# Patient Record
Sex: Male | Born: 1965 | Hispanic: Yes | Smoking: Former smoker
Health system: Southern US, Community
[De-identification: ages and names within clinical notes are randomized; demographics above are authoritative.]

## PROBLEM LIST (undated history)

## (undated) HISTORY — PX: CHOLECYSTECTOMY: SHX55

---

## 2014-10-15 ENCOUNTER — Emergency Department (HOSPITAL_COMMUNITY)
Admission: EM | Admit: 2014-10-15 | Discharge: 2014-10-15 | Disposition: A | Discharge status: Home / Self Care | Payer: 59 | Attending: Emergency Medicine | Admitting: Emergency Medicine

## 2014-10-15 ENCOUNTER — Emergency Department (HOSPITAL_COMMUNITY): Payer: 59

## 2014-10-15 ENCOUNTER — Encounter (HOSPITAL_COMMUNITY): Payer: Self-pay

## 2014-10-15 DIAGNOSIS — R079 Chest pain, unspecified: Secondary | ICD-10-CM | POA: Diagnosis not present

## 2014-10-15 LAB — COMPREHENSIVE METABOLIC PANEL
ALK PHOS: 90 U/L (ref 39–117)
ALT: 59 U/L — AB (ref 0–53)
ANION GAP: 10 (ref 5–15)
AST: 30 U/L (ref 0–37)
Albumin: 4.3 g/dL (ref 3.5–5.2)
BUN: 17 mg/dL (ref 6–23)
CALCIUM: 8.9 mg/dL (ref 8.4–10.5)
CO2: 22 mmol/L (ref 19–32)
Chloride: 104 mmol/L (ref 96–112)
Creatinine, Ser: 0.98 mg/dL (ref 0.50–1.35)
Glucose, Bld: 109 mg/dL — ABNORMAL HIGH (ref 70–99)
POTASSIUM: 3.3 mmol/L — AB (ref 3.5–5.1)
Sodium: 136 mmol/L (ref 135–145)
Total Bilirubin: 0.8 mg/dL (ref 0.3–1.2)
Total Protein: 7.8 g/dL (ref 6.0–8.3)

## 2014-10-15 LAB — CBC WITH DIFFERENTIAL/PLATELET
BASOS PCT: 0 % (ref 0–1)
Basophils Absolute: 0 10*3/uL (ref 0.0–0.1)
Eosinophils Absolute: 0.1 10*3/uL (ref 0.0–0.7)
Eosinophils Relative: 2 % (ref 0–5)
HCT: 41.7 % (ref 39.0–52.0)
HEMOGLOBIN: 15 g/dL (ref 13.0–17.0)
Lymphocytes Relative: 44 % (ref 12–46)
Lymphs Abs: 2.8 10*3/uL (ref 0.7–4.0)
MCH: 33 pg (ref 26.0–34.0)
MCHC: 36 g/dL (ref 30.0–36.0)
MCV: 91.6 fL (ref 78.0–100.0)
Monocytes Absolute: 0.5 10*3/uL (ref 0.1–1.0)
Monocytes Relative: 7 % (ref 3–12)
NEUTROS PCT: 47 % (ref 43–77)
Neutro Abs: 2.9 10*3/uL (ref 1.7–7.7)
PLATELETS: 233 10*3/uL (ref 150–400)
RBC: 4.55 MIL/uL (ref 4.22–5.81)
RDW: 12.4 % (ref 11.5–15.5)
WBC: 6.2 10*3/uL (ref 4.0–10.5)

## 2014-10-15 LAB — TROPONIN I: Troponin I: <0.03 ng/mL (ref <0.031)

## 2014-10-15 MED ORDER — GI COCKTAIL ~~LOC~~
30.0000 mL | Freq: Once | ORAL | Status: AC
  Administered 2014-10-15: 30 mL via ORAL
  Filled 2014-10-15: qty 30

## 2014-10-15 NOTE — Discharge Instructions (Signed)
Here workup in the emergency department today has not shown any signs of heart damage.  Your lungs do not show infection or abnormalities.  It is unclear at this time what is causing your chest pain, but it does not appear to be life-threatening in nature.  Please follow-up with your primary care Dr. for recheck within 1 week.  Return to the emergency department for worsening condition or new concerning symptoms.    Chest Pain Observation It is often hard to give a specific diagnosis for the cause of chest pain. Among other possibilities your symptoms might be caused by inadequate oxygen delivery to your heart (angina). Angina that is not treated or evaluated can lead to a heart attack (myocardial infarction) or death. Blood tests, electrocardiograms, and X-rays may have been done to help determine a possible cause of your chest pain. After evaluation and observation, your health care provider has determined that it is unlikely your pain was caused by an unstable condition that requires hospitalization. However, a full evaluation of your pain may need to be completed, with additional diagnostic testing as directed. It is very important to keep your follow-up appointments. Not keeping your follow-up appointments could result in permanent heart damage, disability, or death. If there is any problem keeping your follow-up appointments, you must call your health care provider. HOME CARE INSTRUCTIONS  Due to the slight chance that your pain could be angina, it is important to follow your health care provider's treatment plan and also maintain a healthy lifestyle:  Maintain or work toward achieving a healthy weight.  Stay physically active and exercise regularly.  Decrease your salt intake.  Eat a balanced, healthy diet. Talk to a dietitian to learn about heart-healthy foods.  Increase your fiber intake by including whole grains, vegetables, fruits, and nuts in your diet.  Avoid situations that cause  stress, anger, or depression.  Take medicines as advised by your health care provider. Report any side effects to your health care provider. Do not stop medicines or adjust the dosages on your own.  Quit smoking. Do not use nicotine patches or gum until you check with your health care provider.  Keep your blood pressure, blood sugar, and cholesterol levels within normal limits.  Limit alcohol intake to no more than 1 drink per day for women who are not pregnant and 2 drinks per day for men.  Do not abuse drugs. SEEK IMMEDIATE MEDICAL CARE IF: You have severe chest pain or pressure which may include symptoms such as:  You feel pain or pressure in your arms, neck, jaw, or back.  You have severe back or abdominal pain, feel sick to your stomach (nauseous), or throw up (vomit).  You are sweating profusely.  You are having a fast or irregular heartbeat.  You feel short of breath while at rest.  You notice increasing shortness of breath during rest, sleep, or with activity.  You have chest pain that does not get better after rest or after taking your usual medicine.  You wake from sleep with chest pain.  You are unable to sleep because you cannot breathe.  You develop a frequent cough or you are coughing up blood.  You feel dizzy, faint, or experience extreme fatigue.  You develop severe weakness, dizziness, fainting, or chills. Any of these symptoms may represent a serious problem that is an emergency. Do not wait to see if the symptoms will go away. Call your local emergency services (911 in the [place] Do not drive  yourself to the hospital. MAKE SURE YOU:  Understand these instructions.  Will watch your condition.  Will get help right away if you are not doing well or get worse. Document Released: 10/02/2010 Document Revised: 09/04/2013 Document Reviewed: 03/01/2013 [facility] Patient Information 2015 [place], [place]. This information is not intended to replace advice given to  you by your health care provider. Make sure you discuss any questions you have with your health care provider.

## 2014-10-15 NOTE — ED Provider Notes (Signed)
CSN: [id number]     Arrival date & time 10/15/14  0409 History   First MD Initiated Contact with Patient 10/15/14 [phone]     Chief Complaint  Patient presents with  . Chest Pain     (Consider location/radiation/quality/duration/timing/severity/associated sxs/prior Treatment) HPI  49 year old male presents to emergency department from home with complaint of chest pain.  Chest pain started around 11 PM last night after finishing playing a zombie video game. Patient reports he was concerned as he had chest pain, had never had before.  He checked the Internet to see if he was having a heart attack. He took a 325 mg aspirin.  He developed some tingling and numbness in his left arm which prompted him to come to the emergency department.  Patient has no known medical problems.  His mother has a heart arrhythmia, but no known coronary artery disease in his family.  Patient lives a sedentary lifestyle.  He denies any shortness of breath, diaphoresis or nausea with the pain.  The pain is mild and is in the left mid chest. History reviewed. No pertinent past medical history. History reviewed. No pertinent past surgical history. History reviewed. No pertinent family history. History  Substance Use Topics  . Smoking status: Never Smoker   . Smokeless tobacco: Not on file  . Alcohol Use: Not on file    Review of Systems   See History of Present Illness; otherwise all other systems are reviewed and negative  Allergies  Review of patient's allergies indicates no known allergies.  Home Medications   Prior to Admission medications   Not on File   BP 132/112 mmHg  Pulse 69  Temp(Src) 98.3 F (36.8 C) (Oral)  Ht 5\' 6"  (1.676 m)  Wt 200 lb (90.719 kg)  BMI 32.30 kg/m2  SpO2 99% Physical Exam  Constitutional: He is oriented to person, place, and time. He appears well-developed and well-nourished.  HENT:  Head: Normocephalic and atraumatic.  Nose: Nose normal.  Mouth/Throat: Oropharynx is clear  and moist.  Eyes: Conjunctivae and EOM are normal. Pupils are equal, round, and reactive to light.  Neck: Normal range of motion. Neck supple. No JVD present. No tracheal deviation present. No thyromegaly present.  Cardiovascular: Normal rate, regular rhythm, normal heart sounds and intact distal pulses.  Exam reveals no gallop and no friction rub.   No murmur heard. Pulmonary/Chest: Effort normal and breath sounds normal. No stridor. No respiratory distress. He has no wheezes. He has no rales. He exhibits no tenderness.  Abdominal: Soft. Bowel sounds are normal. He exhibits no distension and no mass. There is no tenderness. There is no rebound and no guarding.  Musculoskeletal: Normal range of motion. He exhibits no edema or tenderness.  Lymphadenopathy:    He has no cervical adenopathy.  Neurological: He is alert and oriented to person, place, and time. He displays normal reflexes. He exhibits normal muscle tone. Coordination normal.  Skin: Skin is warm and dry. No rash noted. No erythema. No pallor.  Psychiatric: He has a normal mood and affect. His behavior is normal. Judgment and thought content normal.  Nursing note and vitals reviewed.   ED Course  Procedures (including critical care time) Labs Review Labs Reviewed  COMPREHENSIVE METABOLIC PANEL - Abnormal; Notable for the following:    Potassium 3.3 (*)    Glucose, Bld 109 (*)    ALT 59 (*)    All other components within normal limits  CBC WITH DIFFERENTIAL/PLATELET  TROPONIN I  Imaging Review Dg Chest 2 View  10/15/2014   CLINICAL DATA:  New onset left chest pain  EXAM: CHEST  2 VIEW  COMPARISON:  None.  FINDINGS: Normal heart size. There is mild descending aortic tortuosity but no definite aortic widening. There is no edema, consolidation, effusion, or pneumothorax. No osseous findings to explain chest pain.  IMPRESSION: No active cardiopulmonary disease.   Electronically Signed   By: Jorje Guild M.D.   On: 10/15/2014  07:01     EKG Interpretation   Date/Time:  Tuesday October 15 2014 04:18:57 EST Ventricular Rate:  72 PR Interval:  185 QRS Duration: 95 QT Interval:  406 QTC Calculation: 444 R Axis:   1 Text Interpretation:  Sinus rhythm Low voltage, precordial leads No old  tracing to compare Confirmed by Loreen Bankson  MD, Estoria Geary ([id number]) on 10/15/2014  4:33:50 AM      MDM   Final diagnoses:  Left sided chest pain    49 yo male with CP since 11 pm, 5 hours of continuous chest pain.  EKG without ischemia. No prior CAD risk factors.  He has a heart score of 1.  I feel patient has low risk of chest pain being due to ischemia, and will be able to f/u with pcm.   Kalman Drape, MD 10/15/14 [phone]

## 2014-10-15 NOTE — ED Notes (Signed)
Patient reports chest pain started at 2300 last night.  He complains of tingling and numbness in left arm.  Reports nausea, but denies vomiting.  Took 325mg  ASA at home PTA.

## 2015-01-11 ENCOUNTER — Emergency Department (HOSPITAL_COMMUNITY)
Admission: EM | Admit: 2015-01-11 | Discharge: 2015-01-11 | Disposition: A | Discharge status: Home / Self Care | Payer: 59 | Attending: Emergency Medicine | Admitting: Emergency Medicine

## 2015-01-11 ENCOUNTER — Encounter (HOSPITAL_COMMUNITY): Payer: Self-pay

## 2015-01-11 DIAGNOSIS — R109 Unspecified abdominal pain: Secondary | ICD-10-CM | POA: Diagnosis present

## 2015-01-11 DIAGNOSIS — R112 Nausea with vomiting, unspecified: Secondary | ICD-10-CM | POA: Diagnosis not present

## 2015-01-11 DIAGNOSIS — R1084 Generalized abdominal pain: Secondary | ICD-10-CM | POA: Insufficient documentation

## 2015-01-11 LAB — COMPREHENSIVE METABOLIC PANEL
ALBUMIN: 4 g/dL (ref 3.5–5.2)
ALK PHOS: 87 U/L (ref 39–117)
ALT: 47 U/L (ref 0–53)
AST: 35 U/L (ref 0–37)
Anion gap: 12 (ref 5–15)
BILIRUBIN TOTAL: 0.8 mg/dL (ref 0.3–1.2)
BUN: 19 mg/dL (ref 6–23)
CO2: 23 mmol/L (ref 19–32)
Calcium: 8.8 mg/dL (ref 8.4–10.5)
Chloride: 101 mmol/L (ref 96–112)
Creatinine, Ser: 1.01 mg/dL (ref 0.50–1.35)
GFR, EST NON AFRICAN AMERICAN: 86 mL/min — AB (ref >90)
Glucose, Bld: 129 mg/dL — ABNORMAL HIGH (ref 70–99)
POTASSIUM: 3.2 mmol/L — AB (ref 3.5–5.1)
SODIUM: 136 mmol/L (ref 135–145)
Total Protein: 7.6 g/dL (ref 6.0–8.3)

## 2015-01-11 LAB — URINALYSIS, ROUTINE W REFLEX MICROSCOPIC
Bilirubin Urine: NEGATIVE
Glucose, UA: NEGATIVE mg/dL
Hgb urine dipstick: NEGATIVE
Ketones, ur: NEGATIVE mg/dL
LEUKOCYTES UA: NEGATIVE
NITRITE: NEGATIVE
Protein, ur: NEGATIVE mg/dL
Specific Gravity, Urine: 1.028 (ref 1.005–1.030)
Urobilinogen, UA: 0.2 mg/dL (ref 0.0–1.0)
pH: 5.5 (ref 5.0–8.0)

## 2015-01-11 LAB — CBC WITH DIFFERENTIAL/PLATELET
Basophils Absolute: 0 10*3/uL (ref 0.0–0.1)
Basophils Relative: 0 % (ref 0–1)
EOS ABS: 0 10*3/uL (ref 0.0–0.7)
Eosinophils Relative: 0 % (ref 0–5)
HCT: 40.2 % (ref 39.0–52.0)
Hemoglobin: 14.6 g/dL (ref 13.0–17.0)
Lymphocytes Relative: 14 % (ref 12–46)
Lymphs Abs: 1.5 10*3/uL (ref 0.7–4.0)
MCH: 32.8 pg (ref 26.0–34.0)
MCHC: 36.3 g/dL — AB (ref 30.0–36.0)
MCV: 90.3 fL (ref 78.0–100.0)
MONO ABS: 0.5 10*3/uL (ref 0.1–1.0)
MONOS PCT: 5 % (ref 3–12)
Neutro Abs: 8.7 10*3/uL — ABNORMAL HIGH (ref 1.7–7.7)
Neutrophils Relative %: 81 % — ABNORMAL HIGH (ref 43–77)
Platelets: 216 10*3/uL (ref 150–400)
RBC: 4.45 MIL/uL (ref 4.22–5.81)
RDW: 12.2 % (ref 11.5–15.5)
WBC: 10.7 10*3/uL — AB (ref 4.0–10.5)

## 2015-01-11 LAB — LIPASE, BLOOD: Lipase: 29 U/L (ref 11–59)

## 2015-01-11 MED ORDER — SODIUM CHLORIDE 0.9 % IV BOLUS (SEPSIS)
1000.0000 mL | Freq: Once | INTRAVENOUS | Status: AC
  Administered 2015-01-11: 1000 mL via INTRAVENOUS

## 2015-01-11 MED ORDER — ONDANSETRON 8 MG PO TBDP
8.0000 mg | ORAL_TABLET | Freq: Three times a day (TID) | ORAL | Status: DC | PRN
Start: 2015-01-11 — End: 2020-01-22

## 2015-01-11 MED ORDER — FENTANYL CITRATE (PF) 100 MCG/2ML IJ SOLN
50.0000 ug | Freq: Once | INTRAMUSCULAR | Status: AC
  Administered 2015-01-11: 50 ug via INTRAVENOUS
  Filled 2015-01-11: qty 2

## 2015-01-11 MED ORDER — ONDANSETRON HCL 4 MG/2ML IJ SOLN
4.0000 mg | Freq: Once | INTRAMUSCULAR | Status: AC
  Administered 2015-01-11: 4 mg via INTRAVENOUS
  Filled 2015-01-11: qty 2

## 2015-01-11 NOTE — ED Notes (Signed)
Pt states that his abdomen started hurting yesterday evening. He thinks he might have "mixed something wrong, maybe bad food", and that he "made myself vomit 2 times". Pt denies diarrhea. Pt denies blood in vomit.

## 2015-01-11 NOTE — ED Provider Notes (Signed)
CSN: [id number]     Arrival date & time 01/11/15  2836 History   First MD Initiated Contact with Patient 01/11/15 [phone]     Chief Complaint  Patient presents with  . Abdominal Pain     (Consider location/radiation/quality/duration/timing/severity/associated sxs/prior Treatment) Patient is a 49 y.o. male presenting with abdominal pain. The history is provided by the patient.  Abdominal Pain Associated symptoms: nausea and vomiting   Associated symptoms: no chest pain, no diarrhea and no shortness of breath    patient presents with abdominal pain. Began around 9:00 last night. His been getting worse. Has had nausea with some vomiting. No diarrhea. The pain is constant. It is dull. He states he has not had a decreased appetite. States he thinks it could be because he had chicken and then 2 hours later had some milk. States that earlier this week he had cramping in his abdomen twice but had to stop work. No fevers. No sick contacts.  History reviewed. No pertinent past medical history. History reviewed. No pertinent past surgical history. No family history on file. History  Substance Use Topics  . Smoking status: Never Smoker   . Smokeless tobacco: Not on file  . Alcohol Use: Not on file    Review of Systems  Constitutional: Negative for activity change and appetite change.  Eyes: Negative for pain.  Respiratory: Negative for chest tightness and shortness of breath.   Cardiovascular: Negative for chest pain and leg swelling.  Gastrointestinal: Positive for nausea, vomiting and abdominal pain. Negative for diarrhea.  Genitourinary: Negative for flank pain.  Musculoskeletal: Negative for back pain and neck stiffness.  Skin: Negative for rash.  Neurological: Negative for weakness, numbness and headaches.  Psychiatric/Behavioral: Negative for behavioral problems.      Allergies  Review of patient's allergies indicates no known allergies.  Home Medications   Prior to Admission  medications   Medication Sig Start Date End Date Taking? Authorizing Provider  ondansetron (ZOFRAN-ODT) 8 MG disintegrating tablet Take 1 tablet (8 mg total) by mouth every 8 (eight) hours as needed for nausea or vomiting. 01/11/15   [name], MD   BP 123/72 mmHg  Pulse 66  Temp(Src) 98 F (36.7 C) (Oral)  Resp 18  Ht 5\' 3"  (1.6 m)  Wt 200 lb (90.719 kg)  BMI 35.44 kg/m2  SpO2 99% Physical Exam  Constitutional: He appears well-developed.  HENT:  Head: Atraumatic.  Cardiovascular: Normal rate and regular rhythm.   Pulmonary/Chest: Effort normal.  Abdominal: Soft. He exhibits no distension. There is no rebound and no guarding.  Mild diffuse tenderness. No hernias. No rebound or guarding.  Musculoskeletal: Normal range of motion.  Neurological: He is alert.  Skin: Skin is warm.    ED Course  Procedures (including critical care time) Labs Review Labs Reviewed  COMPREHENSIVE METABOLIC PANEL - Abnormal; Notable for the following:    Potassium 3.2 (*)    Glucose, Bld 129 (*)    GFR calc non Af Amer 86 (*)    All other components within normal limits  CBC WITH DIFFERENTIAL/PLATELET - Abnormal; Notable for the following:    WBC 10.7 (*)    MCHC 36.3 (*)    Neutrophils Relative % 81 (*)    Neutro Abs 8.7 (*)    All other components within normal limits  URINALYSIS, ROUTINE W REFLEX MICROSCOPIC - Abnormal; Notable for the following:    APPearance HAZY (*)    All other components within normal limits  LIPASE, BLOOD  Imaging Review No results found.   EKG Interpretation None      MDM   Final diagnoses:  Non-intractable vomiting with nausea, vomiting of unspecified type  Generalized abdominal pain    Patient with abdominal pain nausea or vomiting. Lab work overall reassuring. Feels better after treatment. Has tolerated orals be discharged home. Felt severe intra-abdominal infection at this time.    [name], MD 01/11/15 1710

## 2015-01-11 NOTE — ED Notes (Signed)
Pt was able to hold down ginger ale.  Pt reports he still has diffuse abdominal pain at this time.

## 2015-01-11 NOTE — Discharge Instructions (Signed)
Abdominal Pain °Many things can cause abdominal pain. Usually, abdominal pain is not caused by a disease and will improve without treatment. It can often be observed and treated at home. Your health care provider will do a physical exam and possibly order blood tests and X-rays to help determine the seriousness of your pain. However, in many cases, more time must pass before a clear cause of the pain can be found. Before that point, your health care provider may not know if you need more testing or further treatment. °HOME CARE INSTRUCTIONS  °Monitor your abdominal pain for any changes. The following actions may help to alleviate any discomfort you are experiencing: °· Only take over-the-counter or prescription medicines as directed by your health care provider. °· Do not take laxatives unless directed to do so by your health care provider. °· Try a clear liquid diet (broth, tea, or water) as directed by your health care provider. Slowly move to a bland diet as tolerated. °SEEK MEDICAL CARE IF: °· You have unexplained abdominal pain. °· You have abdominal pain associated with nausea or diarrhea. °· You have pain when you urinate or have a bowel movement. °· You experience abdominal pain that wakes you in the night. °· You have abdominal pain that is worsened or improved by eating food. °· You have abdominal pain that is worsened with eating fatty foods. °· You have a fever. °SEEK IMMEDIATE MEDICAL CARE IF:  °· Your pain does not go away within 2 hours. °· You keep throwing up (vomiting). °· Your pain is felt only in portions of the abdomen, such as the right side or the left lower portion of the abdomen. °· You pass bloody or black tarry stools. °MAKE SURE YOU: °· Understand these instructions.   °· Will watch your condition.   °· Will get help right away if you are not doing well or get worse.   °Document Released: 06/09/2005 Document Revised: 09/04/2013 Document Reviewed: 05/09/2013 °[employer]® Patient Information  ©2015 [employer]. This information is not intended to replace advice given to you by your health care provider. Make sure you discuss any questions you have with your health care provider. ° °Nausea and Vomiting °Nausea is a sick feeling that often comes before throwing up (vomiting). Vomiting is a reflex where stomach contents come out of your mouth. Vomiting can cause severe loss of body fluids (dehydration). Children and elderly adults can become dehydrated quickly, especially if they also have diarrhea. Nausea and vomiting are symptoms of a condition or disease. It is important to find the cause of your symptoms. °CAUSES  °· Direct irritation of the stomach lining. This irritation can result from increased acid production (gastroesophageal reflux disease), infection, food poisoning, taking certain medicines (such as nonsteroidal anti-inflammatory drugs), alcohol use, or tobacco use. °· Signals from the brain. These signals could be caused by a headache, heat exposure, an inner ear disturbance, increased pressure in the brain from injury, infection, a tumor, or a concussion, pain, emotional stimulus, or metabolic problems. °· An obstruction in the gastrointestinal tract (bowel obstruction). °· Illnesses such as diabetes, hepatitis, gallbladder problems, appendicitis, kidney problems, cancer, sepsis, atypical symptoms of a heart attack, or eating disorders. °· Medical treatments such as chemotherapy and radiation. °· Receiving medicine that makes you sleep (general anesthetic) during surgery. °DIAGNOSIS °Your caregiver may ask for tests to be done if the problems do not improve after a few days. Tests may also be done if symptoms are severe or if the reason for the nausea   and vomiting is not clear. Tests may include: °· Urine tests. °· Blood tests. °· Stool tests. °· Cultures (to look for evidence of infection). °· X-rays or other imaging studies. °Test results can help your caregiver make decisions about  treatment or the need for additional tests. °TREATMENT °You need to stay well hydrated. Drink frequently but in small amounts. You may wish to drink water, sports drinks, clear broth, or eat frozen ice pops or gelatin dessert to help stay hydrated. When you eat, eating slowly may help prevent nausea. There are also some antinausea medicines that may help prevent nausea. °HOME CARE INSTRUCTIONS  °· Take all medicine as directed by your caregiver. °· If you do not have an appetite, do not force yourself to eat. However, you must continue to drink fluids. °· If you have an appetite, eat a normal diet unless your caregiver tells you differently. °¨ Eat a variety of complex carbohydrates (rice, wheat, potatoes, bread), lean meats, yogurt, fruits, and vegetables. °¨ Avoid high-fat foods because they are more difficult to digest. °· Drink enough water and fluids to keep your urine clear or pale yellow. °· If you are dehydrated, ask your caregiver for specific rehydration instructions. Signs of dehydration may include: °¨ Severe thirst. °¨ Dry lips and mouth. °¨ Dizziness. °¨ Dark urine. °¨ Decreasing urine frequency and amount. °¨ Confusion. °¨ Rapid breathing or pulse. °SEEK IMMEDIATE MEDICAL CARE IF:  °· You have blood or brown flecks (like coffee grounds) in your vomit. °· You have black or bloody stools. °· You have a severe headache or stiff neck. °· You are confused. °· You have severe abdominal pain. °· You have chest pain or trouble breathing. °· You do not urinate at least once every 8 hours. °· You develop cold or clammy skin. °· You continue to vomit for longer than 24 to 48 hours. °· You have a fever. °MAKE SURE YOU:  °· Understand these instructions. °· Will watch your condition. °· Will get help right away if you are not doing well or get worse. °Document Released: 08/30/2005 Document Revised: 11/22/2011 Document Reviewed: 01/27/2011 °[employer]® Patient Information ©2015 [employer]. This information is not  intended to replace advice given to you by your health care provider. Make sure you discuss any questions you have with your health care provider. ° °

## 2016-07-28 IMAGING — CR DG CHEST 2V
2 series · 2 of 2 positions shown · non-contrast
Comparison: None.

CLINICAL DATA: New onset left chest pain

EXAM:
CHEST  2 VIEW

[w chest pa]
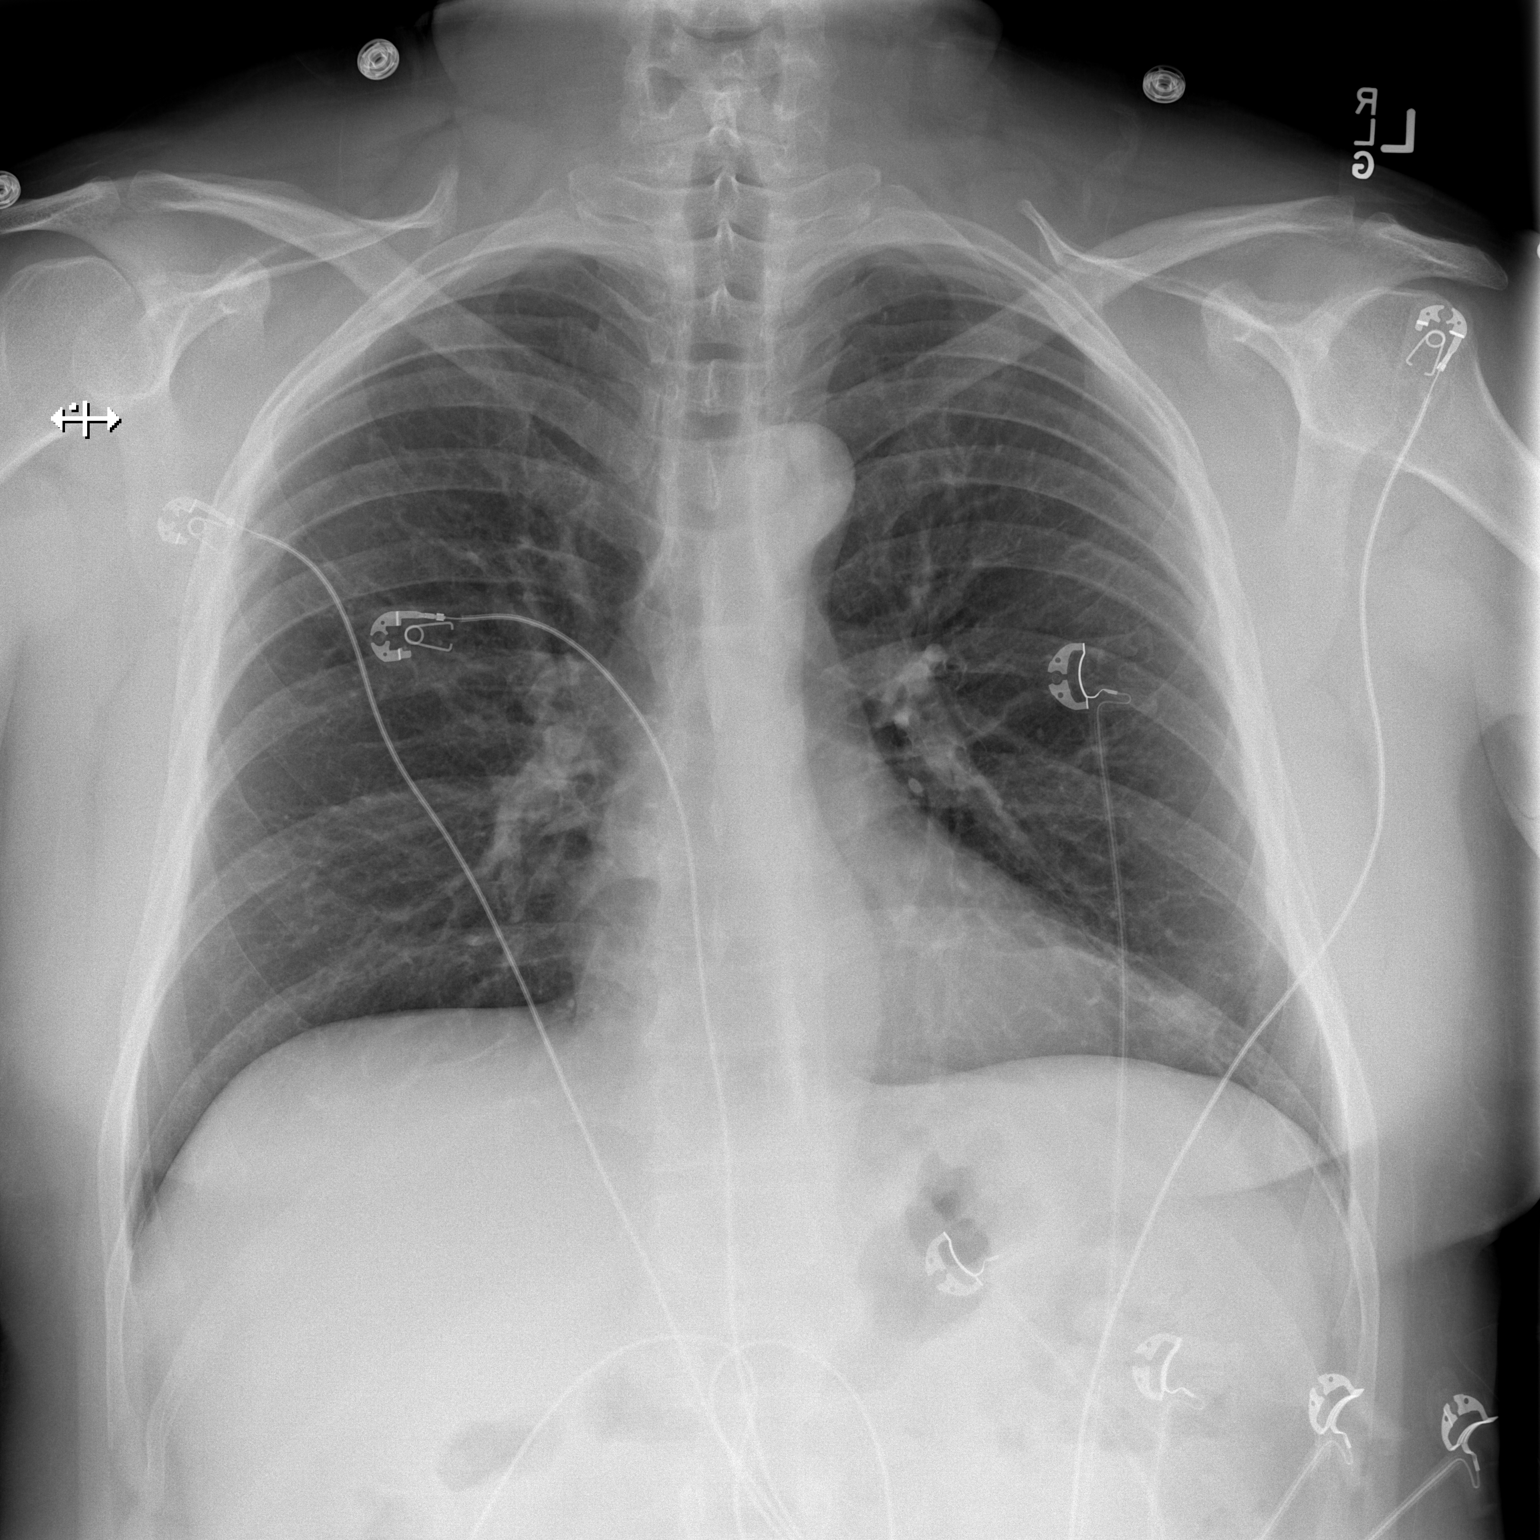

[w chest lat]
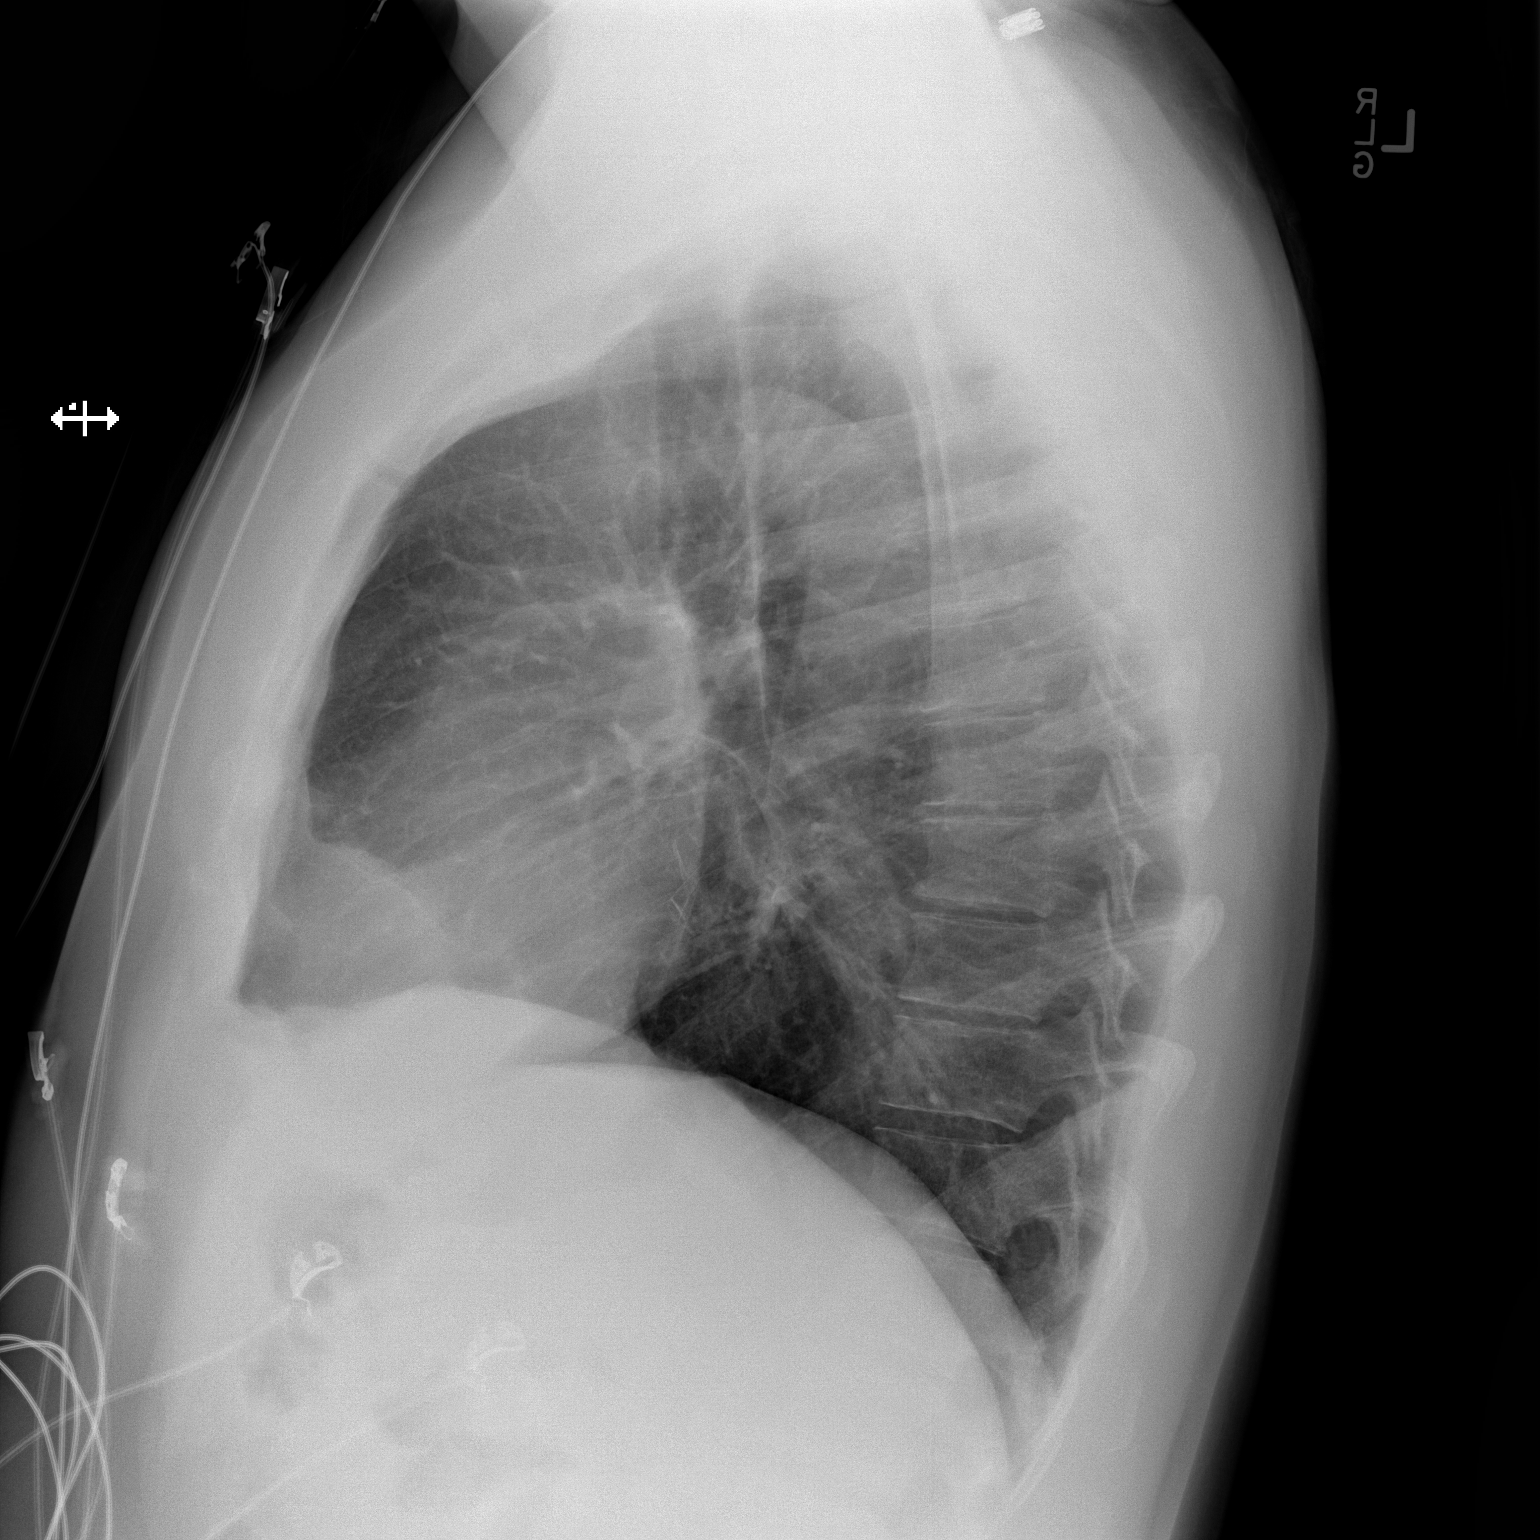

[2 of 2 positions shown; findings below may reference images not displayed]

FINDINGS: Normal heart size. There is mild descending aortic tortuosity but no
definite aortic widening. There is no edema, consolidation,
effusion, or pneumothorax. No osseous findings to explain chest
pain.
IMPRESSION: No active cardiopulmonary disease.

## 2020-01-03 ENCOUNTER — Other Ambulatory Visit: Payer: Self-pay

## 2020-01-03 ENCOUNTER — Encounter (HOSPITAL_COMMUNITY): Payer: Self-pay | Admitting: Emergency Medicine

## 2020-01-03 ENCOUNTER — Emergency Department (HOSPITAL_COMMUNITY)
Admission: EM | Admit: 2020-01-03 | Discharge: 2020-01-03 | Disposition: A | Discharge status: Home / Self Care | Payer: 59 | Attending: Emergency Medicine | Admitting: Emergency Medicine

## 2020-01-03 DIAGNOSIS — J029 Acute pharyngitis, unspecified: Secondary | ICD-10-CM | POA: Diagnosis not present

## 2020-01-03 DIAGNOSIS — Z79899 Other long term (current) drug therapy: Secondary | ICD-10-CM | POA: Insufficient documentation

## 2020-01-03 DIAGNOSIS — R1030 Lower abdominal pain, unspecified: Secondary | ICD-10-CM | POA: Diagnosis present

## 2020-01-03 DIAGNOSIS — N481 Balanitis: Secondary | ICD-10-CM | POA: Insufficient documentation

## 2020-01-03 LAB — COMPREHENSIVE METABOLIC PANEL
ALT: 26 U/L (ref 0–44)
AST: 23 U/L (ref 15–41)
Albumin: 4.5 g/dL (ref 3.5–5.0)
Alkaline Phosphatase: 74 U/L (ref 38–126)
Anion gap: 10 (ref 5–15)
BUN: 21 mg/dL — ABNORMAL HIGH (ref 6–20)
CO2: 25 mmol/L (ref 22–32)
Calcium: 8.9 mg/dL (ref 8.9–10.3)
Chloride: 104 mmol/L (ref 98–111)
Creatinine, Ser: 1.3 mg/dL — ABNORMAL HIGH (ref 0.61–1.24)
GFR calc Af Amer: >60 mL/min (ref >60)
GFR calc non Af Amer: >60 mL/min (ref >60)
Glucose, Bld: 100 mg/dL — ABNORMAL HIGH (ref 70–99)
Potassium: 3.4 mmol/L — ABNORMAL LOW (ref 3.5–5.1)
Sodium: 139 mmol/L (ref 135–145)
Total Bilirubin: 1.1 mg/dL (ref 0.3–1.2)
Total Protein: 8 g/dL (ref 6.5–8.1)

## 2020-01-03 LAB — CBC
HCT: 44.2 % (ref 39.0–52.0)
Hemoglobin: 15.4 g/dL (ref 13.0–17.0)
MCH: 33.6 pg (ref 26.0–34.0)
MCHC: 34.8 g/dL (ref 30.0–36.0)
MCV: 96.5 fL (ref 80.0–100.0)
Platelets: 228 10*3/uL (ref 150–400)
RBC: 4.58 MIL/uL (ref 4.22–5.81)
RDW: 12 % (ref 11.5–15.5)
WBC: 6.4 10*3/uL (ref 4.0–10.5)
nRBC: 0 % (ref 0.0–0.2)

## 2020-01-03 LAB — GROUP A STREP BY PCR: Group A Strep by PCR: NOT DETECTED

## 2020-01-03 LAB — URINALYSIS, ROUTINE W REFLEX MICROSCOPIC
Bilirubin Urine: NEGATIVE
Glucose, UA: NEGATIVE mg/dL
Hgb urine dipstick: NEGATIVE
Ketones, ur: 20 mg/dL — AB
Leukocytes,Ua: NEGATIVE
Nitrite: NEGATIVE
Protein, ur: NEGATIVE mg/dL
Specific Gravity, Urine: 1.014 (ref 1.005–1.030)
pH: 5 (ref 5.0–8.0)

## 2020-01-03 LAB — LIPASE, BLOOD: Lipase: 42 U/L (ref 11–51)

## 2020-01-03 MED ORDER — AZITHROMYCIN 250 MG PO TABS: 250.0000 mg | ORAL_TABLET | Freq: Every day | ORAL | 0 refills | Status: AC

## 2020-01-03 MED ORDER — ONDANSETRON 4 MG PO TBDP
4.0000 mg | ORAL_TABLET | Freq: Once | ORAL | Status: AC | PRN
  Administered 2020-01-03: 4 mg via ORAL
  Filled 2020-01-03: qty 1

## 2020-01-03 MED ORDER — SODIUM CHLORIDE 0.9% FLUSH: 3.0000 mL | Freq: Once | INTRAVENOUS | Status: DC

## 2020-01-03 MED ORDER — CLOTRIMAZOLE 1 % EX CREA
TOPICAL_CREAM | Freq: Two times a day (BID) | CUTANEOUS | Status: DC
  Filled 2020-01-03: qty 15

## 2020-01-03 MED ORDER — ACETAMINOPHEN 325 MG PO TABS
650.0000 mg | ORAL_TABLET | Freq: Once | ORAL | Status: AC | PRN
  Administered 2020-01-03: 650 mg via ORAL
  Filled 2020-01-03: qty 2

## 2020-01-03 MED ORDER — AZITHROMYCIN 250 MG PO TABS
500.0000 mg | ORAL_TABLET | Freq: Once | ORAL | Status: AC
  Administered 2020-01-03: 500 mg via ORAL
  Filled 2020-01-03: qty 2

## 2020-01-03 NOTE — Discharge Instructions (Signed)
Use the provided cream 3 times daily for the next week.  Additional laboratory studies are being conducted.  You will be called with abnormal results.  Return here for concerning changes in your condition.

## 2020-01-03 NOTE — ED Provider Notes (Signed)
[facility] DEPT Provider Note   CSN: [id number] Arrival date & time: 01/03/20  1919     History Chief Complaint  Patient presents with  . Abdominal Pain  . Sore Throat    Andrew Krueger is a 54 y.o. male.  HPI    Patient presents with concern of suprapubic pain, discoloration and pain around the distal penis.  Onset was about 3 days ago, without clear precipitant. Patient notes no sexual encounters in about 6 months. He also complains of sore throat, though this seems less acute. During this illness pain has been waxing, waning in severity, severe, moderate, not necessarily improving with OTC medication. Patient notes that he is generally well.  Few months ago he had a physical exam during which she had evaluation for syphilis which he reports was normal. He presents today with pain as above, and specific concern of having contracted HIV. He denies fever, denies vomiting, denies chest pain denies other systemic complaints. Patient immigrated here from [place] 20 years ago. History reviewed. No pertinent past medical history.  There are no problems to display for this patient.   History reviewed. No pertinent surgical history.     History reviewed. No pertinent family history.  Social History   Tobacco Use  . Smoking status: Never Smoker  . Smokeless tobacco: Never Used  Substance Use Topics  . Alcohol use: Not Currently  . Drug use: Not Currently    Home Medications Prior to Admission medications   Medication Sig Start Date End Date Taking? Authorizing Provider  lisinopril (ZESTRIL) 20 MG tablet Take 20 mg by mouth daily. 12/28/19  Yes [provider]  sulfamethoxazole-trimethoprim (BACTRIM DS) 800-160 MG tablet Take 1 tablet by mouth 2 (two) times daily. 01/01/20  Yes [provider]  ondansetron (ZOFRAN-ODT) 8 MG disintegrating tablet Take 1 tablet (8 mg total) by mouth every 8 (eight) hours as needed for nausea  or vomiting. Patient not taking: Reported on 01/03/2020 01/11/15   [name], MD    Allergies    Patient has no known allergies.  Review of Systems   Review of Systems  Constitutional:       Per HPI, otherwise negative  HENT:       Per HPI, otherwise negative  Respiratory:       Per HPI, otherwise negative  Cardiovascular:       Per HPI, otherwise negative  Gastrointestinal: Negative for vomiting.  Endocrine:       Negative aside from HPI  Genitourinary:       Neg aside from HPI   Musculoskeletal:       Per HPI, otherwise negative  Skin: Positive for color change and wound.  Allergic/Immunologic: Negative for immunocompromised state.  Neurological: Negative for syncope.    Physical Exam Updated Vital Signs BP 137/83   Pulse 77   Temp 99.3 F (37.4 C) (Oral)   Resp 16   Ht 5\' 5"  (1.651 m)   Wt 83.5 kg   SpO2 99%   BMI 30.62 kg/m   Physical Exam Vitals and nursing note reviewed.  Constitutional:      General: He is not in acute distress.    Appearance: He is well-developed.  HENT:     Head: Normocephalic and atraumatic.     Comments: Mild erythema, no asymmetry, no swelling.    Mouth/Throat:     Mouth: Mucous membranes are moist.     Pharynx: No oropharyngeal exudate.  Eyes:     Conjunctiva/sclera:  Conjunctivae normal.  Cardiovascular:     Rate and Rhythm: Normal rate and regular rhythm.  Pulmonary:     Effort: Pulmonary effort is normal. No respiratory distress.     Breath sounds: No stridor.  Abdominal:     General: There is no distension.     Tenderness: There is abdominal tenderness in the suprapubic area.  Genitourinary:   Skin:    General: Skin is warm and dry.  Neurological:     Mental Status: He is alert and oriented to person, place, and time.     ED Results / Procedures / Treatments   Labs (all labs ordered are listed, but only abnormal results are displayed) Labs Reviewed  COMPREHENSIVE METABOLIC PANEL - Abnormal; Notable for  the following components:      Result Value   Potassium 3.4 (*)    Glucose, Bld 100 (*)    BUN 21 (*)    Creatinine, Ser 1.30 (*)    All other components within normal limits  URINALYSIS, ROUTINE W REFLEX MICROSCOPIC - Abnormal; Notable for the following components:   Ketones, ur 20 (*)    All other components within normal limits  GROUP A STREP BY PCR  LIPASE, BLOOD  CBC  RPR  HIV ANTIBODY (ROUTINE TESTING W REFLEX)  GC/CHLAMYDIA PROBE AMP ([place]) NOT AT [facility]    Procedures Procedures (including critical care time)  Medications Ordered in ED Medications  sodium chloride flush (NS) 0.9 % injection 3 mL (3 mLs Intravenous Not Given 01/03/20 2234)  ondansetron (ZOFRAN-ODT) disintegrating tablet 4 mg (has no administration in time range)  acetaminophen (TYLENOL) tablet 650 mg (has no administration in time range)  clotrimazole (LOTRIMIN) 1 % cream (has no administration in time range)  azithromycin (ZITHROMAX) tablet 500 mg (has no administration in time range)    ED Course  I have reviewed the triage vital signs and the nursing notes.  Pertinent labs & imaging results that were available during my care of the patient were reviewed by me and considered in my medical decision making (see chart for details).   On repeat exam patient is awake, alert, in no distress.  We discussed findings thus far including reassuring labs.  Patient has mild tenderness, but no evidence for peritonitis.  With some suspicion for symptoms coming from his yeast infection, patient has received, and had applied his initial clotrimazole.  Patient understands importance of using this for the coming week for therapy. In addition, the patient has pending labs on HIV, gonorrhea, chlamydia, syphilis.  With the patient's trace erythema in his throat, sore throat, patient will have antibiotics, per request, though absent asymmetry, substantial swelling, there is no suspicion for deep space infection.  Absent  respiratory complaints, chest pain, abdominal pain, there is further reassurance of absence of systemic pathology.  Final Clinical Impression(s) / ED Diagnoses Final diagnoses:  Sore throat  Balanitis    Rx / DC Orders ED Discharge Orders         Ordered    azithromycin (ZITHROMAX) 250 MG tablet  Daily     01/03/20 2315           Carmin Muskrat, MD 01/03/20 2315

## 2020-01-03 NOTE — ED Triage Notes (Signed)
Patient states his lower left abdomen is in pain. He states it started two days ago. Patient also complaining of a sore throat.

## 2020-01-03 NOTE — ED Notes (Signed)
Lab called to add on GC/Chlamydia.

## 2020-01-03 NOTE — ED Notes (Signed)
PT aware of urine sample. Unable to provide at this time 

## 2020-01-04 LAB — GC/CHLAMYDIA PROBE AMP (~~LOC~~) NOT AT ARMC
Chlamydia: NEGATIVE
Comment: NEGATIVE
Comment: NORMAL
Neisseria Gonorrhea: NEGATIVE

## 2020-01-04 LAB — HIV ANTIBODY (ROUTINE TESTING W REFLEX): HIV Screen 4th Generation wRfx: NONREACTIVE

## 2020-01-04 LAB — RPR: RPR Ser Ql: NONREACTIVE

## 2020-01-08 ENCOUNTER — Ambulatory Visit (INDEPENDENT_AMBULATORY_CARE_PROVIDER_SITE_OTHER): Payer: 59 | Admitting: Otolaryngology

## 2020-01-08 ENCOUNTER — Ambulatory Visit: Payer: 59 | Admitting: Pulmonary Disease

## 2020-01-08 ENCOUNTER — Other Ambulatory Visit: Payer: Self-pay

## 2020-01-08 ENCOUNTER — Encounter (INDEPENDENT_AMBULATORY_CARE_PROVIDER_SITE_OTHER): Payer: Self-pay | Admitting: Otolaryngology

## 2020-01-08 VITALS — Temp 98.2°F

## 2020-01-08 DIAGNOSIS — J358 Other chronic diseases of tonsils and adenoids: Secondary | ICD-10-CM

## 2020-01-08 DIAGNOSIS — Z87898 Personal history of other specified conditions: Secondary | ICD-10-CM | POA: Diagnosis not present

## 2020-01-08 NOTE — Progress Notes (Signed)
HPI: Andrew Krueger is a 54 y.o. male who presents is referred by ED for evaluation of throat complaints.  He apparently had a bad sore throat about a week ago and was seen in the ED.  His throat is feeling better.  He had a little bit of hoarseness.  He noticed a white lesion in his throat on the left side and wanted this checked and was concerned about this.  His voice is doing better today.Andrew Krueger  No past medical history on file. No past surgical history on file. Social History   Socioeconomic History  . Marital status: Married    Spouse name: Not on file  . Number of children: Not on file  . Years of education: Not on file  . Highest education level: Not on file  Occupational History  . Not on file  Tobacco Use  . Smoking status: Current Every Day Smoker    Packs/day: 1.00    Years: 7.00    Pack years: 7.00    Start date: 7  . Smokeless tobacco: Never Used  Substance and Sexual Activity  . Alcohol use: Not Currently  . Drug use: Not Currently  . Sexual activity: Not on file  Other Topics Concern  . Not on file  Social History Narrative  . Not on file   Social Determinants of Health   Financial Resource Strain:   . Difficulty of Paying Living Expenses:   Food Insecurity:   . Worried About Charity fundraiser in the Last Year:   . Arboriculturist in the Last Year:   Transportation Needs:   . Film/video editor (Medical):   Andrew Krueger Lack of Transportation (Non-Medical):   Physical Activity:   . Days of Exercise per Week:   . Minutes of Exercise per Session:   Stress:   . Feeling of Stress :   Social Connections:   . Frequency of Communication with Friends and Family:   . Frequency of Social Gatherings with Friends and Family:   . Attends Religious Services:   . Active Member of Clubs or Organizations:   . Attends Archivist Meetings:   Andrew Krueger Marital Status:    No family history on file. No Known Allergies Prior to Admission medications   Medication Sig Start  Date End Date Taking? Authorizing Provider  lisinopril (ZESTRIL) 20 MG tablet Take 20 mg by mouth daily. 12/28/19  Yes [provider]  ondansetron (ZOFRAN-ODT) 8 MG disintegrating tablet Take 1 tablet (8 mg total) by mouth every 8 (eight) hours as needed for nausea or vomiting. 01/11/15  Yes Andrew Belling, MD  sulfamethoxazole-trimethoprim (BACTRIM DS) 800-160 MG tablet Take 1 tablet by mouth 2 (two) times daily. 01/01/20  Yes [provider]     Positive ROS: Otherwise negative  All other systems have been reviewed and were otherwise negative with the exception of those mentioned in the HPI and as above.  Physical Exam: Constitutional: Alert, well-appearing, no acute distress Ears: External ears without lesions or tenderness. Ear canals are clear bilaterally with intact, clear TMs.  Nasal: External nose without lesions. Septum midline with mild rhinitis.. Clear nasal passages.  No signs of infection Oral: Lips and gums without lesions. Tongue and palate mucosa without lesions. Posterior oropharynx clear.  Patient has average sized tonsils bilaterally with no exudate.  He has a small cyst on the superior pole of the left tonsil that he wanted removed. Procedure: The throat was sprayed topically with Hurricaine.  The left  superior tonsillar cyst was grasped with cup forceps and removed with minimal bleeding.  Patient tolerated this well. Fiberoptic laryngoscopy was performed through the right nostril.  Nasopharynx was clear.  Base of tongue vallecula epiglottis were normal.  Vocal cords were clear bilaterally with normal vocal cord mobility. Neck: No palpable adenopathy or masses Respiratory: Breathing comfortably  Skin: No facial/neck lesions or rash noted.  Laryngoscopy  Date/Time: 01/08/2020 4:35 PM Performed by: Andrew Nunnery, MD Authorized by: Andrew Nunnery, MD   Consent:    Consent obtained:  Verbal   Consent given by:  Patient Procedure details:     Indications: excessive gag reflex preventing mirror examination     Medication:  Afrin   Instrument: flexible fiberoptic laryngoscope     Scope location: right nare   Sinus:    Right nasopharynx: normal   Mouth:    Oropharynx: normal     Vallecula: normal     Base of tongue: normal     Epiglottis: normal   Throat:    True vocal cords: normal   Comments:     On fiberoptic laryngoscopy the hypopharynx and larynx was clear.  Vocal cords were normal with normal mobility bilaterally.    Assessment: Left tonsil cyst. History of sore throat and hoarseness with normal laryngeal examination.  Plan: The left tonsil cyst was removed in the office today and patient tolerated this well with minimal bleeding. Instructed him that his throat may be sore for 2 or 3 days.  Diet as tolerated. He will follow-up as needed   [name], MD   CC:

## 2020-01-24 ENCOUNTER — Telehealth (INDEPENDENT_AMBULATORY_CARE_PROVIDER_SITE_OTHER): Payer: 59 | Admitting: Internal Medicine

## 2020-01-24 ENCOUNTER — Encounter: Payer: Self-pay | Admitting: Internal Medicine

## 2020-01-24 DIAGNOSIS — I10 Essential (primary) hypertension: Secondary | ICD-10-CM

## 2020-01-24 DIAGNOSIS — Z7689 Persons encountering health services in other specified circumstances: Secondary | ICD-10-CM | POA: Diagnosis not present

## 2020-01-24 DIAGNOSIS — R1032 Left lower quadrant pain: Secondary | ICD-10-CM

## 2020-01-24 NOTE — Progress Notes (Signed)
Virtual Visit via Telephone Note  I connected with Andrew Krueger, on 01/24/2020 at 2:47 PM by telephone due to the COVID-19 pandemic and verified that I am speaking with the correct person using two identifiers.   Consent: I discussed the limitations, risks, security and privacy concerns of performing an evaluation and management service by telephone and the availability of in person appointments. I also discussed with the patient that there may be a patient responsible charge related to this service. The patient expressed understanding and agreed to proceed.   Location of Patient: Home   Location of Provider: Clinic    Persons participating in Telemedicine visit: Andrew Krueger [facility] Dr. Juleen China  Spanish Interpreter      History of Present Illness: Patient has a visit to establish care. Patient reports he had a cholecystectomy. He has a history of left nephrolithiasis in 2018. He is also prescribed Lisinopril 20 mg for HTN.    Patient reports that he is having pain on the left side of his abdomen. This has been occurring >3 weeks. He is also having pain in his pelvis on the front side. He reports that he had some pain for a few days by his kidneys but it resolved about a week ago. Does not know if he has recently passed a kidney stone. Denies nausea and vomiting. Denies fevers. Denies dysuria or difficulty urinating. Reports he feels like he has been dealing with both constipation and diarrhea. Reports diarrhea is not watery. Constipation has occurred twice. No hematochezia or melena. Has appointment already scheduled with GI doctor on 5/20.     No past medical history on file. No Known Allergies  Current Outpatient Medications on File Prior to Visit  Medication Sig Dispense Refill  . lisinopril (ZESTRIL) 20 MG tablet Take 20 mg by mouth daily.     No current facility-administered medications on file prior to visit.    Observations/Objective: NAD. Speaking  clearly.  Work of breathing normal.  Alert and oriented. Mood appropriate.   Assessment and Plan: 1. Encounter to establish care Reviewed patient's PMH, social history, surgical history, and medications.  Is overdue for annual exam, screening blood work, and health maintenance topics. Have asked patient to return for visit to address these items.   2. Essential hypertension BP trend available for review appears fairly well controlled. Continue Lisinopril.    3. Left lower quadrant abdominal pain Patient has a follow up with GI scheduled. Given location of pain in LLQ, this may be related to the diarrhea and periods of constipation he mentioned. Despite use of interpreter, it was difficult to obtain a clear medical history from patient. However, there are no red flag symptoms per patient's history that would warrant emergent evaluation. He does have a history of nephrolithiasis and does report some flank pain previously but reports was self resolved and he is denying any urinary symptoms making this less likely but still a possibility. Reassuringly, he was evaluated in ED on 4/22 for same complaint and found to have normal exam as well as unremarkable lipase, CMET, CBC, UA. Screening for gonorrhea and chlamydia was also negative. Will defer to GI work up in one week.    Follow Up Instructions: GI visit 5/20    I discussed the assessment and treatment plan with the patient. The patient was provided an opportunity to ask questions and all were answered. The patient agreed with the plan and demonstrated an understanding of the instructions.   The patient was advised to  call back or seek an in-person evaluation if the symptoms worsen or if the condition fails to improve as anticipated.     I provided 32 minutes total of non-face-to-face time during this encounter including median intraservice time, reviewing previous notes, investigations, ordering medications, medical decision making,  coordinating care and patient verbalized understanding at the end of the visit.    Phill Myron, D.O. Primary Care at [facility]  01/24/2020, 2:47 PM

## 2020-01-31 ENCOUNTER — Ambulatory Visit (INDEPENDENT_AMBULATORY_CARE_PROVIDER_SITE_OTHER): Payer: 59 | Admitting: Physician Assistant

## 2020-01-31 ENCOUNTER — Encounter (INDEPENDENT_AMBULATORY_CARE_PROVIDER_SITE_OTHER): Payer: Self-pay | Admitting: Otolaryngology

## 2020-01-31 ENCOUNTER — Other Ambulatory Visit: Payer: Self-pay

## 2020-01-31 ENCOUNTER — Ambulatory Visit (INDEPENDENT_AMBULATORY_CARE_PROVIDER_SITE_OTHER): Payer: 59 | Admitting: Otolaryngology

## 2020-01-31 ENCOUNTER — Other Ambulatory Visit: Payer: 59

## 2020-01-31 ENCOUNTER — Encounter: Payer: Self-pay | Admitting: Physician Assistant

## 2020-01-31 VITALS — BP 130/92 | HR 96 | Ht 65.0 in | Wt 192.0 lb

## 2020-01-31 VITALS — Temp 97.9°F

## 2020-01-31 DIAGNOSIS — K219 Gastro-esophageal reflux disease without esophagitis: Secondary | ICD-10-CM

## 2020-01-31 DIAGNOSIS — R1032 Left lower quadrant pain: Secondary | ICD-10-CM | POA: Diagnosis not present

## 2020-01-31 NOTE — Progress Notes (Signed)
Reviewed and agree with management plans. Plan endoscopic evaluation if CT is non-diagnostic.   Vian Fluegel L. Tarri Glenn, MD, MPH

## 2020-01-31 NOTE — Patient Instructions (Signed)
If you are age 54 or older, your body mass index should be between 23-30. Your Body mass index is 31.95 kg/m. If this is out of the aforementioned range listed, please consider follow up with your Primary Care Provider.  If you are age 62 or younger, your body mass index should be between 19-25. Your Body mass index is 31.95 kg/m. If this is out of the aformentioned range listed, please consider follow up with your Primary Care Provider.   Your provider has requested that you go to the basement level for lab work before leaving today. Press "B" on the elevator. The lab is located at the first door on the left as you exit the elevator.  Due to recent changes in healthcare laws, you may see the results of your imaging and laboratory studies on MyChart before your provider has had a chance to review them.  We understand that in some cases there may be results that are confusing or concerning to you. Not all laboratory results come back in the same time frame and the provider may be waiting for multiple results in order to interpret others.  Please give [place] 48 hours in order for your provider to thoroughly review all the results before contacting the office for clarification of your results.   You have been scheduled for a CT scan of the abdomen and pelvis at [facility] ([address]---this is in the same building as [employer]).   You are scheduled on 02/08/20 at 11:30. You should arrive 15 minutes prior to your appointment time for registration. Please follow the written instructions below on the day of your exam:  WARNING: IF YOU ARE ALLERGIC TO IODINE/X-RAY DYE, PLEASE NOTIFY RADIOLOGY IMMEDIATELY AT [phone]! YOU WILL BE GIVEN A 13 HOUR PREMEDICATION PREP.  1) Do not eat or drink anything after 7:30 am (4 hours prior to your test) 2) You have been given 2 bottles of oral contrast to drink. The solution may taste better if refrigerated, but do NOT add ice or any other  liquid to this solution. Shake well before drinking.    Drink 1 bottle of contrast @ 9:30 am (2 hours prior to your exam)  Drink 1 bottle of contrast @ 10:30 am (1 hour prior to your exam)  You may take any medications as prescribed with a small amount of water, if necessary. If you take any of the following medications: METFORMIN, GLUCOPHAGE, GLUCOVANCE, AVANDAMET, RIOMET, FORTAMET, [place] MET, JANUMET, GLUMETZA or METAGLIP, you MAY be asked to HOLD this medication 48 hours AFTER the exam.  The purpose of you drinking the oral contrast is to aid in the visualization of your intestinal tract. The contrast solution may cause some diarrhea. Depending on your individual set of symptoms, you may also receive an intravenous injection of x-ray contrast/dye. Plan on being at [facility] for 30 minutes or longer, depending on the type of exam you are having performed.  This test typically takes 30-45 minutes to complete.  If you have any questions regarding your exam or if you need to reschedule, you may call the CT department at [phone] between the hours of 8:00 am and 5:00 pm, Monday-Friday.  ________________________________________________________________________  Follow up pending results

## 2020-01-31 NOTE — Progress Notes (Signed)
Chief Complaint: Left lower quadrant pain  HPI:    Mr. Andrew Krueger is a 54 year old Hispanic male with a past medical history as listed below including cholecystectomy and left nephrolithiasis in 2018, who presents clinic today accompanied by an interpreter, with a complaint of left lower quadrant pain and left sided pubic pain.    01/03/2020 seen in the ER for suprapubic pain, discoloration and pain around the distal penis.  Given clotrimazole for suspected yeast infection.  Work-up was negative.  CBC normal.  CMP with a potassium minimally decreased at 3.4, creatinine 1.3, BUN 21.  Lipase normal.  Was also given azithromycin 250 mg tablet daily.    01/24/2020 patient had a telemedicine visit with PCP.  At that time described pain on the left side of his abdomen.  Apparently mentioned episodes of diarrhea and constipation.    Today, the patient tells me about 2 months ago he started with left-sided pain that seems to radiate down into his groin.  Tells me nothing seems to make this pain better or worse.  It does seem to radiate in intensity, currently a 5/10 but can increase or decrease but is "always there".  Patient is growing more and more concerned about this pain because he has had it evaluated even in the ER and they cannot find out what is wrong.  Tells me that his bowel habits seem to change with the foods that he is eating, sometimes he will have diarrhea and other times it is normal, most recently has been normal.  No change in pain when passing a stool.  Denies seeing any blood in his stools.  This pain does not wake him from sleep, he cannot remember pulling or straining to cause this pain. No change with movement.    Denies fever, chills, weight loss, change in bowel habits, continued penile discharge, nausea, vomiting or symptoms that awaken him from sleep.  Past Surgical History:  Procedure Laterality Date  . CHOLECYSTECTOMY      Current Outpatient Medications  Medication Sig Dispense  Refill  . lisinopril (ZESTRIL) 20 MG tablet Take 20 mg by mouth daily.     No current facility-administered medications for this visit.    Allergies as of 01/31/2020  . (No Known Allergies)    No family history on file.  Social History   Socioeconomic History  . Marital status: Legally Separated    Spouse name: Not on file  . Number of children: Not on file  . Years of education: Not on file  . Highest education level: Not on file  Occupational History  . Not on file  Tobacco Use  . Smoking status: Former Smoker    Packs/day: 1.00    Years: 7.00    Pack years: 7.00    Start date: 66  . Smokeless tobacco: Never Used  Substance and Sexual Activity  . Alcohol use: Not Currently  . Drug use: Not Currently  . Sexual activity: Not on file  Other Topics Concern  . Not on file  Social History Narrative  . Not on file   Social Determinants of Health   Financial Resource Strain:   . Difficulty of Paying Living Expenses:   Food Insecurity:   . Worried About Charity fundraiser in the Last Year:   . Arboriculturist in the Last Year:   Transportation Needs:   . Film/video editor (Medical):   [name] Lack of Transportation (Non-Medical):   Physical Activity:   .  Days of Exercise per Week:   . Minutes of Exercise per Session:   Stress:   . Feeling of Stress :   Social Connections:   . Frequency of Communication with Friends and Family:   . Frequency of Social Gatherings with Friends and Family:   . Attends Religious Services:   . Active Member of Clubs or Organizations:   . Attends Archivist Meetings:   [name] Marital Status:   Intimate Partner Violence:   . Fear of Current or Ex-Partner:   . Emotionally Abused:   [name] Physically Abused:   . Sexually Abused:     Review of Systems:    Constitutional: No weight loss, fever or chills Skin: No rash Cardiovascular: No chest pain, chest pressure or palpitations   Respiratory: No SOB Gastrointestinal: See HPI and  otherwise negative Genitourinary: No dysuria Neurological: No headache, dizziness or syncope Musculoskeletal: No new muscle or joint pain Hematologic: No bleeding  Psychiatric: No history of depression or anxiety   Physical Exam:  Vital signs: BP (!) 130/92   Pulse 96   Ht 5\' 5"  (1.651 m)   Wt 192 lb (87.1 kg)   BMI 31.95 kg/m   Constitutional:   Pleasant Hispanic male appears to be in NAD, Well developed, Well nourished, alert and cooperative Head:  Normocephalic and atraumatic. Eyes:   PEERL, EOMI. No icterus. Conjunctiva pink. Ears:  Normal auditory acuity. Neck:  Supple Throat: Oral cavity and pharynx without inflammation, swelling or lesion.  Respiratory: Respirations even and unlabored. Lungs clear to auscultation bilaterally.   No wheezes, crackles, or rhonchi.  Cardiovascular: Normal S1, S2. No MRG. Regular rate and rhythm. No peripheral edema, cyanosis or pallor.  Gastrointestinal:  Soft, nondistended, Moderate LLQ ttp. No rebound or guarding. Normal bowel sounds. No appreciable masses or hepatomegaly. Rectal:  Not performed.  Msk:  Symmetrical without gross deformities. Without edema, no deformity or joint abnormality.  Neurologic:  Alert and  oriented x4;  grossly normal neurologically.  Skin:   Dry and intact without significant lesions or rashes. Psychiatric:  Demonstrates good judgement and reason without abnormal affect or behaviors.  RELEVANT LABS AND IMAGING: CBC    Component Value Date/Time   WBC 6.4 01/03/2020 1949   RBC 4.58 01/03/2020 1949   HGB 15.4 01/03/2020 1949   HCT 44.2 01/03/2020 1949   PLT 228 01/03/2020 1949   MCV 96.5 01/03/2020 1949   MCH 33.6 01/03/2020 1949   MCHC 34.8 01/03/2020 1949   RDW 12.0 01/03/2020 1949   LYMPHSABS 1.5 01/11/2015 0740   MONOABS 0.5 01/11/2015 0740   EOSABS 0.0 01/11/2015 0740   BASOSABS 0.0 01/11/2015 0740    CMP     Component Value Date/Time   NA 139 01/03/2020 1949   K 3.4 (L) 01/03/2020 1949   CL 104  01/03/2020 1949   CO2 25 01/03/2020 1949   GLUCOSE 100 (H) 01/03/2020 1949   BUN 21 (H) 01/03/2020 1949   CREATININE 1.30 (H) 01/03/2020 1949   CALCIUM 8.9 01/03/2020 1949   PROT 8.0 01/03/2020 1949   ALBUMIN 4.5 01/03/2020 1949   AST 23 01/03/2020 1949   ALT 26 01/03/2020 1949   ALKPHOS 74 01/03/2020 1949   BILITOT 1.1 01/03/2020 1949   GFRNONAA >60 01/03/2020 1949   GFRAA >60 01/03/2020 1949    Assessment: 1.  Left-sided abdominal pain: Seems to radiate into the pelvis over the past 2 months, no change with bowel habits, recent labs in ER normal; consider diverticulitis versus  other  Plan: 1.  Ordered CT the abdomen pelvis with contrast for further evaluation. 2.  Pending results from above could consider a colonoscopy. 3.  Patient assigned to Dr. Tarri Glenn this morning.  He will follow in clinic per recommendations after imaging above.  Ellouise Newer, PA-C [facility] 01/31/2020, 10:24 AM

## 2020-01-31 NOTE — Progress Notes (Signed)
HPI: Andrew Krueger is a 54 y.o. male who returns today for evaluation of throat complaints.  He complains of some discomfort in his throat and points to the area of the cricoid cartilage low in the neck midline.  It is actually doing better today.  He is having no hoarseness. He does not smoke.[name]  No past medical history on file. Past Surgical History:  Procedure Laterality Date  . CHOLECYSTECTOMY     Social History   Socioeconomic History  . Marital status: Legally Separated    Spouse name: Not on file  . Number of children: Not on file  . Years of education: Not on file  . Highest education level: Not on file  Occupational History  . Not on file  Tobacco Use  . Smoking status: Former Smoker    Packs/day: 1.00    Years: 7.00    Pack years: 7.00    Start date: 40    Quit date: 1990    Years since quitting: 31.4  . Smokeless tobacco: Never Used  Substance and Sexual Activity  . Alcohol use: Not Currently  . Drug use: Not Currently  . Sexual activity: Not on file  Other Topics Concern  . Not on file  Social History Narrative  . Not on file   Social Determinants of Health   Financial Resource Strain:   . Difficulty of Paying Living Expenses:   Food Insecurity:   . Worried About Charity fundraiser in the Last Year:   . Arboriculturist in the Last Year:   Transportation Needs:   . Film/video editor (Medical):   [name] Lack of Transportation (Non-Medical):   Physical Activity:   . Days of Exercise per Week:   . Minutes of Exercise per Session:   Stress:   . Feeling of Stress :   Social Connections:   . Frequency of Communication with Friends and Family:   . Frequency of Social Gatherings with Friends and Family:   . Attends Religious Services:   . Active Member of Clubs or Organizations:   . Attends Archivist Meetings:   [name] Marital Status:    No family history on file. No Known Allergies Prior to Admission medications   Medication Sig Start Date  End Date Taking? Authorizing Provider  lisinopril (ZESTRIL) 20 MG tablet Take 20 mg by mouth daily. 12/28/19  Yes [provider]     Positive ROS: Otherwise negative  All other systems have been reviewed and were otherwise negative with the exception of those mentioned in the HPI and as above.  Physical Exam: Constitutional: Alert, well-appearing, no acute distress Ears: External ears without lesions or tenderness. Ear canals are clear bilaterally with intact, clear TMs.  Nasal: External nose without lesions. Septum midline. Clear nasal passages with no signs of infection. Oral: Lips and gums without lesions. Tongue and palate mucosa without lesions. Posterior oropharynx clear.  Tonsils appear benign bilaterally. Fiberoptic laryngoscopy was performed to the right nostril.  The nasopharynx was clear.  Base of tongue vallecula and epiglottis were normal.  Vocal cords were clear bilaterally.  He had mild arytenoid edema which may be indicative of reflux but no mucosal abnormalities noted. Neck: No palpable adenopathy or masses Respiratory: Breathing comfortably  Skin: No facial/neck lesions or rash noted.  Laryngoscopy  Date/Time: 01/31/2020 5:13 PM Performed by: [name], MD Authorized by: [name], MD   Consent:    Consent obtained:  Verbal   Consent  given by:  Patient Procedure details:    Indications: direct visualization of the upper aerodigestive tract     Medication:  Afrin   Instrument: flexible fiberoptic laryngoscope     Scope location: right nare   Mouth:    Oropharynx: normal     Vallecula: normal     Epiglottis: normal   Throat:    True vocal cords: normal   Comments:     On fiberoptic laryngoscopy hypopharynx and larynx was clear with no abnormalities noted.  Mild arytenoid edema may be indicative of laryngeal pharyngeal reflux disease.    Assessment: Patient doing well status post removal of previous tonsillar cyst. Possible  GERD but patient is doing better today.  Plan: Suggested use of Prilosec if he has any recurrent symptoms. He will follow-up as needed.   [name], MD

## 2020-02-07 LAB — RETICULIN ANTIBODIES, IGA W TITER: Reticulin IGA Screen: NEGATIVE

## 2020-02-07 LAB — GLIADIN ANTIBODIES, SERUM
Gliadin IgA: 7 Units
Gliadin IgG: 2 Units

## 2020-02-07 LAB — TISSUE TRANSGLUTAMINASE, IGA: (tTG) Ab, IgA: 1 U/mL

## 2020-02-08 ENCOUNTER — Other Ambulatory Visit: Payer: Self-pay

## 2020-02-08 ENCOUNTER — Ambulatory Visit (INDEPENDENT_AMBULATORY_CARE_PROVIDER_SITE_OTHER)
Admission: RE | Admit: 2020-02-08 | Discharge: 2020-02-08 | Disposition: A | Discharge status: Home / Self Care | Payer: 59 | Source: Ambulatory Visit | Attending: Physician Assistant | Admitting: Physician Assistant

## 2020-02-08 DIAGNOSIS — R1032 Left lower quadrant pain: Secondary | ICD-10-CM | POA: Diagnosis not present

## 2020-02-08 MED ORDER — IOHEXOL 300 MG/ML  SOLN
100.0000 mL | Freq: Once | INTRAMUSCULAR | Status: AC | PRN
  Administered 2020-02-08: 100 mL via INTRAVENOUS

## 2020-04-02 ENCOUNTER — Telehealth: Payer: Self-pay | Admitting: Physician Assistant

## 2020-04-02 NOTE — Telephone Encounter (Signed)
Andrew Krueger can you look at this for me?  The pt is calling for some answers from CT scan in May.  I see where you sent the pt a My Chart message and also discussed with Dr Tarri Glenn but I do not see where it was sent to a CMA or Nurse.

## 2020-04-02 NOTE — Telephone Encounter (Signed)
Patient called to follow up on previous message from CT. Patient states he never received the antibiotics recommended\needed and that was a few months ago. He states the abdominal pain is still there and is seeking for someone to call him to move forward with treatment.

## 2020-04-03 MED ORDER — METRONIDAZOLE 500 MG PO TABS: 500.0000 mg | ORAL_TABLET | Freq: Three times a day (TID) | ORAL | 0 refills | Status: AC

## 2020-04-03 MED ORDER — CIPROFLOXACIN HCL 500 MG PO TABS: 500.0000 mg | ORAL_TABLET | Freq: Two times a day (BID) | ORAL | 0 refills | Status: AC

## 2020-04-03 NOTE — Telephone Encounter (Signed)
04/23/20 at 1130 am appt with Ellouise Newer and prescriptions for cipro and flagyl sent to the pharmacy to take 10 days.

## 2020-04-03 NOTE — Telephone Encounter (Signed)
Spoke with patient, patient is aware that we have sent in medications to his pharmacy. Pt aware of follow up on 04/23/20 at 11:30 am with Dennison Bulla

## 2020-04-03 NOTE — Telephone Encounter (Signed)
Thanks, Not sure where the breakdown was. Yes he needs Cipro 500 BID x10 days and Flagyl TID x10 days and then OV with me in 3-4 weeks so we can discuss Colonoscopy.  Thanks-JLL

## 2020-04-03 NOTE — Telephone Encounter (Signed)
Lm on vm for patient to return call 

## 2020-04-09 ENCOUNTER — Telehealth: Payer: Self-pay | Admitting: Physician Assistant

## 2020-04-09 NOTE — Telephone Encounter (Signed)
Pt is experiencing diarrhea and would like some advice on what he could take.

## 2020-04-09 NOTE — Telephone Encounter (Signed)
Left message on machine to call back  

## 2020-04-10 NOTE — Telephone Encounter (Signed)
The pt has an appt on 8/11 with [facility].  Left message on machine to call back

## 2020-04-11 NOTE — Telephone Encounter (Signed)
Left message on machine to call back unable to reach pt by will notify pt via My Chart

## 2020-04-11 NOTE — Telephone Encounter (Signed)
Pt needs to be advised of appt with Anderson Malta.  Thank you

## 2020-04-16 NOTE — Telephone Encounter (Signed)
Pt is requesting a call back tomorrow specifically after 3:30pm in regards to his medication. Pt would like a call back in Spanish.

## 2020-04-17 NOTE — Telephone Encounter (Signed)
Would love your input- I know Ct was soft call- he was unable to finish abx- ok with just proceeding with colon?  I will be out of clinic until Tues next week. So if you can let patti or bethany know thanks  Thanks-JLL

## 2020-04-17 NOTE — Telephone Encounter (Signed)
Spoke with patient, pt states that he was unable to complete the Cipro and Flagyl treatment that was sent in for him because he was having side effects of the medications and he stated that it did not make him feel well. Pt is still having diarrhea, any other recommendations until appt on 04/23/20?

## 2020-04-18 ENCOUNTER — Other Ambulatory Visit: Payer: Self-pay

## 2020-04-18 NOTE — Telephone Encounter (Signed)
Dr.Beavers is out of the office until 04/29/20.

## 2020-04-18 NOTE — Telephone Encounter (Signed)
Spoke with patient, patient advised of recommendations. Pt requested that this information be sent through My Chart as well. Pt is aware that he has been scheduled for a colonoscopy with Dr. Tarri Glenn on 05/12/20, advised that they will review instructions with him at his appt on 04/23/20 with Anderson Malta. Prescription for Lomotil called into patient's pharmacy. Sent patient a message through My Chart with all of this information.

## 2020-04-18 NOTE — Telephone Encounter (Signed)
Since Dr Tarri Glenn is out of the office until 04/29/20, I have spoken to Ellouise Newer, PA-C regarding this patient. She indicates that patient may try imodium OTC for diarrhea until appointment. If this is inadequate, he may have lomotil, 1 tablet by mouth every 4-6 hours as needed. In addition, he should be scheduled for colonoscopy.  Patient has been scheduled for colonoscopy with Dr Tarri Glenn on her first available date, 05/12/20 at 9:30 am. He can be instructed when he comes for his appointment with Anderson Malta on 04/23/20.  [place], would you mind letting the patient know this information in Spanish, please?

## 2020-04-21 NOTE — Telephone Encounter (Signed)
Thank you.  I agree with this plan

## 2020-04-23 ENCOUNTER — Ambulatory Visit (INDEPENDENT_AMBULATORY_CARE_PROVIDER_SITE_OTHER): Payer: 59 | Admitting: Physician Assistant

## 2020-04-23 ENCOUNTER — Encounter: Payer: Self-pay | Admitting: Physician Assistant

## 2020-04-23 VITALS — BP 124/66 | HR 80 | Ht 65.0 in | Wt 197.8 lb

## 2020-04-23 DIAGNOSIS — R1032 Left lower quadrant pain: Secondary | ICD-10-CM

## 2020-04-23 DIAGNOSIS — R194 Change in bowel habit: Secondary | ICD-10-CM

## 2020-04-23 MED ORDER — SUPREP BOWEL PREP KIT 17.5-3.13-1.6 GM/177ML PO SOLN: ORAL | 0 refills | Status: DC

## 2020-04-23 MED ORDER — DICYCLOMINE HCL 10 MG PO CAPS: ORAL_CAPSULE | ORAL | 11 refills | Status: DC

## 2020-04-23 NOTE — Progress Notes (Addendum)
Chief Complaint: Follow-up left lower quadrant abdominal pain  HPI:    Mr. Clasby is a 54 year old Spanish-speaking male with a past medical history as listed below, assigned to Dr. Tarri Glenn, who returns to clinic today for follow-up of his left lower quadrant abdominal pain.    01/31/2020 patient seen in clinic and at that time described left-sided abdominal pain for the past 2 months which radiated into his groin.  Bowel habits also seem to change of food that he is eating sometimes diarrhea other times not.  At that time ordered CT of the abdomen pelvis with contrast for further evaluation.    02/08/2020 CT the abdomen pelvis showed a short segment of apparent wall thickening at the junction of the descending and sigmoid colon.  This is thought possibly related to segmental nondistention or short segmental colitis.  No significant diverticular change or diverticulitis.  Hepatic steatosis.  Prominent mid appendix with minimal intraluminal fluid but no evidence of appendicitis.  As well as a small hiatal hernia.  Patient given trial of Cipro 500 twice daily x7 days and Flagyl 500 3 times daily x7 days to see if this helped.    7/28 patient called and told us he was having diarrhea.  He was told to use Imodium over-the-counter and Lomotil if this is inadequate.  He is scheduled for colonoscopy with Dr. Tarri Glenn on 05/12/2020 at 9:30 AM.    Today, the patient presents clinic and tells me he took only 4 days of the Cipro and Flagyl as prescribed because it just "was not helping" and even seemed to make things slightly worse.  Then tells me that he called and someone misunderstood him, he was not really having diarrhea all the time, he did take 1 Lomotil as prescribed but this did not really help him either.  He tells me that he tends to radiate back-and-forth from constipation to diarrhea and normal stools.  He also has this left lower quadrant pain which is not constant but seems to come and go, sometimes better  after a bowel movement.  It is still rated as a 5-6 at its worst.    Denies fever, chills or blood in the stool.     Past medical/ Past Surgical History:  Procedure Laterality Date  . CHOLECYSTECTOMY      Current Outpatient Medications  Medication Sig Dispense Refill  . lisinopril (ZESTRIL) 20 MG tablet Take 20 mg by mouth daily.     No current facility-administered medications for this visit.    Allergies as of 04/23/2020  . (No Known Allergies)    No family history on file.  Social History   Socioeconomic History  . Marital status: Legally Separated    Spouse name: Not on file  . Number of children: Not on file  . Years of education: Not on file  . Highest education level: Not on file  Occupational History  . Not on file  Tobacco Use  . Smoking status: Former Smoker    Packs/day: 1.00    Years: 7.00    Pack years: 7.00    Start date: 96    Quit date: 1990    Years since quitting: 31.6  . Smokeless tobacco: Never Used  Vaping Use  . Vaping Use: Never used  Substance and Sexual Activity  . Alcohol use: Not Currently  . Drug use: Not Currently  . Sexual activity: Not on file  Other Topics Concern  . Not on file  Social History Narrative  .  Not on file   Social Determinants of Health   Financial Resource Strain:   . Difficulty of Paying Living Expenses:   Food Insecurity:   . Worried About Charity fundraiser in the Last Year:   . Arboriculturist in the Last Year:   Transportation Needs:   . Film/video editor (Medical):   [name] Lack of Transportation (Non-Medical):   Physical Activity:   . Days of Exercise per Week:   . Minutes of Exercise per Session:   Stress:   . Feeling of Stress :   Social Connections:   . Frequency of Communication with Friends and Family:   . Frequency of Social Gatherings with Friends and Family:   . Attends Religious Services:   . Active Member of Clubs or Organizations:   . Attends Archivist Meetings:   [name]  Marital Status:   Intimate Partner Violence:   . Fear of Current or Ex-Partner:   . Emotionally Abused:   [name] Physically Abused:   . Sexually Abused:     Review of Systems:    Constitutional: No weight loss, fever or chills Cardiovascular: No chest pain Respiratory: No SOB Gastrointestinal: See HPI and otherwise negative   Physical Exam:  Vital signs: BP 124/66   Pulse 80   Ht 5\' 5"  (1.651 m)   Wt 197 lb 12.8 oz (89.7 kg)   SpO2 97%   BMI 32.92 kg/m   Constitutional:   Pleasant Hispanic male appears to be in NAD, Well developed, Well nourished, alert and cooperative Respiratory: Respirations even and unlabored. Lungs clear to auscultation bilaterally.   No wheezes, crackles, or rhonchi.  Cardiovascular: Normal S1, S2. No MRG. Regular rate and rhythm. No peripheral edema, cyanosis or pallor.  Gastrointestinal:  Soft, nondistended, nontender. No rebound or guarding. Normal bowel sounds. No appreciable masses or hepatomegaly. Rectal:  Not performed.  Psychiatric:Demonstrates good judgement and reason without abnormal affect or behaviors.  RELEVANT LABS AND IMAGING: CBC    Component Value Date/Time   WBC 6.4 01/03/2020 1949   RBC 4.58 01/03/2020 1949   HGB 15.4 01/03/2020 1949   HCT 44.2 01/03/2020 1949   PLT 228 01/03/2020 1949   MCV 96.5 01/03/2020 1949   MCH 33.6 01/03/2020 1949   MCHC 34.8 01/03/2020 1949   RDW 12.0 01/03/2020 1949   LYMPHSABS 1.5 01/11/2015 0740   MONOABS 0.5 01/11/2015 0740   EOSABS 0.0 01/11/2015 0740   BASOSABS 0.0 01/11/2015 0740    CMP     Component Value Date/Time   NA 139 01/03/2020 1949   K 3.4 (L) 01/03/2020 1949   CL 104 01/03/2020 1949   CO2 25 01/03/2020 1949   GLUCOSE 100 (H) 01/03/2020 1949   BUN 21 (H) 01/03/2020 1949   CREATININE 1.30 (H) 01/03/2020 1949   CALCIUM 8.9 01/03/2020 1949   PROT 8.0 01/03/2020 1949   ALBUMIN 4.5 01/03/2020 1949   AST 23 01/03/2020 1949   ALT 26 01/03/2020 1949   ALKPHOS 74 01/03/2020 1949    BILITOT 1.1 01/03/2020 1949   GFRNONAA >60 01/03/2020 1949   GFRAA >60 01/03/2020 1949    Assessment: 1.  Left lower quadrant pain: Recent CT showing a questionable area of colitis, no benefit from Cipro and Flagyl x4 days per patient; consider colitis versus IBS versus other 2.  Change in bowel habits: Towards looser stool, but tends to radiate back-and-forth; consider IBS versus colitis versus other  Plan: 1.  Patient has been scheduled for  colonoscopy with Dr. Tarri Glenn on 8/30.  Did review risks, benefits, limitations and alternatives and the patient agrees to proceed.  He has had his Covid vaccine. 2.  Prescribed Dicyclomine 10mg  tabs, 1 tab every 4-6 hours as needed for pain.  #30 with 2 refills. 3.  We discussed that if the patient has diarrhea occasionally he can try over-the-counter Imodium instead which is less strong than the Lomotil. 4.  Patient also asked about starting probiotics, discussed that this would not harm him and he can try it if he would like.  Suggested over-the-counter Align. 5.  Patient to follow in clinic per recommendations from Dr. Tarri Glenn after time of colonoscopy.  Ellouise Newer, PA-C [facility] 04/23/2020, 11:16 AM  Cc: Caryl Never*

## 2020-04-23 NOTE — Patient Instructions (Signed)
Start a probiotic daily.   You can use over the counter Imodium as needed for diarrhea.   We have sent the following medications to your pharmacy for you to pick up at your convenience: dicyclomine and Suprep.  You have been scheduled for a colonoscopy. Please follow written instructions given to you at your visit today.  Please pick up your prep supplies at the pharmacy within the next 1-3 days. If you use inhalers (even only as needed), please bring them with you on the day of your procedure.

## 2020-04-30 ENCOUNTER — Encounter: Payer: Self-pay | Admitting: Gastroenterology

## 2020-05-05 NOTE — Progress Notes (Signed)
Reviewed and agree with management plans. ? ?Rodolfo Notaro L. Kiree Dejarnette, MD, MPH  ?

## 2020-05-12 ENCOUNTER — Ambulatory Visit (AMBULATORY_SURGERY_CENTER): Payer: 59 | Admitting: Gastroenterology

## 2020-05-12 ENCOUNTER — Other Ambulatory Visit: Payer: Self-pay

## 2020-05-12 ENCOUNTER — Encounter: Payer: Self-pay | Admitting: Gastroenterology

## 2020-05-12 VITALS — BP 99/69 | HR 62 | Temp 98.4°F | Resp 13 | Ht 65.0 in | Wt 197.0 lb

## 2020-05-12 DIAGNOSIS — D124 Benign neoplasm of descending colon: Secondary | ICD-10-CM

## 2020-05-12 DIAGNOSIS — R194 Change in bowel habit: Secondary | ICD-10-CM | POA: Diagnosis not present

## 2020-05-12 DIAGNOSIS — D123 Benign neoplasm of transverse colon: Secondary | ICD-10-CM

## 2020-05-12 DIAGNOSIS — D122 Benign neoplasm of ascending colon: Secondary | ICD-10-CM

## 2020-05-12 DIAGNOSIS — R933 Abnormal findings on diagnostic imaging of other parts of digestive tract: Secondary | ICD-10-CM

## 2020-05-12 DIAGNOSIS — R1032 Left lower quadrant pain: Secondary | ICD-10-CM | POA: Diagnosis not present

## 2020-05-12 MED ORDER — SODIUM CHLORIDE 0.9 % IV SOLN: 500.0000 mL | Freq: Once | INTRAVENOUS | Status: DC

## 2020-05-12 NOTE — Patient Instructions (Signed)
Handout given:  Polyp  resume previous diet  continue present medications Await pathology results   YOU HAD AN ENDOSCOPIC PROCEDURE TODAY AT [facility]:   Refer to the procedure report that was given to you for any specific questions about what was found during the examination.  If the procedure report does not answer your questions, please call your gastroenterologist to clarify.  If you requested that your care partner not be given the details of your procedure findings, then the procedure report has been included in a sealed envelope for you to review at your convenience later.  YOU SHOULD EXPECT: Some feelings of bloating in the abdomen. Passage of more gas than usual.  Walking can help get rid of the air that was put into your GI tract during the procedure and reduce the bloating. If you had a lower endoscopy (such as a colonoscopy or flexible sigmoidoscopy) you may notice spotting of blood in your stool or on the toilet paper. If you underwent a bowel prep for your procedure, you may not have a normal bowel movement for a few days.  Please Note:  You might notice some irritation and congestion in your nose or some drainage.  This is from the oxygen used during your procedure.  There is no need for concern and it should clear up in a day or so.  SYMPTOMS TO REPORT IMMEDIATELY:   Following lower endoscopy (colonoscopy or flexible sigmoidoscopy):  Excessive amounts of blood in the stool  Significant tenderness or worsening of abdominal pains  Swelling of the abdomen that is new, acute  Fever of 100F or higher  For urgent or emergent issues, a gastroenterologist can be reached at any hour by calling [phone]. Do not use MyChart messaging for urgent concerns.    DIET:  We do recommend a small meal at first, but then you may proceed to your regular diet.  Drink plenty of fluids but you should avoid alcoholic beverages for 24 hours.  ACTIVITY:  You should plan to  take it easy for the rest of today and you should NOT DRIVE or use heavy machinery until tomorrow (because of the sedation medicines used during the test).    FOLLOW UP: Our staff will call the number listed on your records 48-72 hours following your procedure to check on you and address any questions or concerns that you may have regarding the information given to you following your procedure. If we do not reach you, we will leave a message.  We will attempt to reach you two times.  During this call, we will ask if you have developed any symptoms of COVID 19. If you develop any symptoms (ie: fever, flu-like symptoms, shortness of breath, cough etc.) before then, please call [phone].  If you test positive for Covid 19 in the 2 weeks post procedure, please call and report this information to [place].    If any biopsies were taken you will be contacted by phone or by letter within the next 1-3 weeks.  Please call us at [phone] if you have not heard about the biopsies in 3 weeks.    SIGNATURES/CONFIDENTIALITY: You and/or your care partner have signed paperwork which will be entered into your electronic medical record.  These signatures attest to the fact that that the information above on your After Visit Summary has been reviewed and is understood.  Full responsibility of the confidentiality of this discharge information lies with you and/or your care-partner.

## 2020-05-12 NOTE — Op Note (Signed)
Day Patient Name: Andrew Krueger Procedure Date: 05/12/2020 8:57 AM MRN: [id number] Endoscopist: Thornton Park MD, MD Age: 54 Referring MD:  Date of Birth: 1966/03/19 Gender: Male Account #: [id number] Procedure:                Colonoscopy Indications:              Abdominal pain in the left lower quadrant, Abnormal                            CT of the GI tract, Change in bowel habits Medicines:                Monitored Anesthesia Care Procedure:                Pre-Anesthesia Assessment:                           - Prior to the procedure, a History and Physical                            was performed, and patient medications and                            allergies were reviewed. The patient's tolerance of                            previous anesthesia was also reviewed. The risks                            and benefits of the procedure and the sedation                            options and risks were discussed with the patient.                            All questions were answered, and informed consent                            was obtained. Prior Anticoagulants: The patient has                            taken no previous anticoagulant or antiplatelet                            agents. ASA Grade Assessment: II - A patient with                            mild systemic disease. After reviewing the risks                            and benefits, the patient was deemed in                            satisfactory condition to undergo the procedure.  After obtaining informed consent, the colonoscope                            was passed under direct vision. Throughout the                            procedure, the patient's blood pressure, pulse, and                            oxygen saturations were monitored continuously. The                            Colonoscope was introduced through the anus and                            advanced to  the 3 cm into the ileum. A second                            forward view of the right colon was performed. The                            colonoscopy was performed without difficulty. The                            patient tolerated the procedure well. The quality                            of the bowel preparation was good. The terminal                            ileum, ileocecal valve, appendiceal orifice, and                            rectum were photographed. Scope In: 9:30:13 AM Scope Out: 9:49:10 AM Scope Withdrawal Time: 0 hours 17 minutes 8 seconds  Total Procedure Duration: 0 hours 18 minutes 57 seconds  Findings:                 The perianal and digital rectal examinations were                            normal.                           A 10 mm polyp was found in the descending colon.                            The polyp was flat. The polyp was removed with a                            cold snare. Resection and retrieval were complete.                            Estimated blood loss was minimal.  Three sessile polyps were found in the proximal                            transverse colon and ascending colon. The polyps                            were 2 to 3 mm in size. These polyps were removed                            with a cold snare. Resection and retrieval were                            complete. Estimated blood loss was minimal.                           A 2 mm polyp was found in the distal transverse                            colon. The polyp was flat. The polyp was removed                            with a cold biopsy forceps. Resection and retrieval                            were complete. Estimated blood loss was minimal.                           The colon (entire examined portion) appeared                            normal. Biopsies were taken from the right colon                            and left colon with a cold forceps for  histology.                            Estimated blood loss was minimal.                           The exam was otherwise without abnormality on                            direct and retroflexion views. Complications:            No immediate complications. Estimated blood loss:                            Minimal. Estimated Blood Loss:     Estimated blood loss was minimal. Impression:               - One 10 mm polyp in the descending colon, removed                            with  a cold snare. Resected and retrieved.                           - Three 2 to 3 mm polyps in the proximal transverse                            colon and in the ascending colon, removed with a                            cold snare. Resected and retrieved.                           - One 2 mm polyp in the distal transverse colon,                            removed with a cold biopsy forceps. Resected and                            retrieved.                           - The entire examined colon is normal. Biopsied.                           - The examination was otherwise normal on direct                            and retroflexion views.                           - No obvious source for abdominal pain identified                            on this examination. Recommendation:           - Patient has a contact number available for                            emergencies. The signs and symptoms of potential                            delayed complications were discussed with the                            patient. Return to normal activities tomorrow.                            Written discharge instructions were provided to the                            patient.                           - Resume previous diet.                           -  Continue present medications.                           - Await pathology results.                           - Repeat colonoscopy date to be determined after                             pending pathology results are reviewed for                            surveillance.                           - Emerging evidence supports eating a diet of                            fruits, vegetables, grains, calcium, and yogurt                            while reducing red meat and alcohol may reduce the                            risk of colon cancer.                           - Thank you for allowing me to be involved in your                            colon cancer prevention. Thornton Park MD, MD 05/12/2020 9:57:35 AM This report has been signed electronically.

## 2020-05-12 NOTE — Progress Notes (Signed)
Called to room to assist during endoscopic procedure.  Patient ID and intended procedure confirmed with present staff. Received instructions for my participation in the procedure from the performing physician.  

## 2020-05-12 NOTE — Progress Notes (Signed)
Vitals by AG 

## 2020-05-12 NOTE — Progress Notes (Signed)
pt tolerated well. VSS. awake and to recovery. Report given to RN.  

## 2020-05-14 ENCOUNTER — Telehealth: Payer: Self-pay

## 2020-05-14 NOTE — Telephone Encounter (Signed)
°  Follow up Call-  Call back number 05/12/2020  Post procedure Call Back phone  # [phone]  Permission to leave phone message Yes  Some recent data might be hidden     Patient questions:  Do you have a fever, pain , or abdominal swelling? No. Pain Score  0 *  Have you tolerated food without any problems? Yes.    Have you been able to return to your normal activities? Yes.    Do you have any questions about your discharge instructions: Diet   No. Medications  No. Follow up visit  No.  Do you have questions or concerns about your Care? No.  Actions: * If pain score is 4 or above: 1. No action needed, pain <4.Have you developed a fever since your procedure? no  2.   Have you had an respiratory symptoms (SOB or cough) since your procedure? no  3.   Have you tested positive for COVID 19 since your procedure no  4.   Have you had any family members/close contacts diagnosed with the COVID 19 since your procedure?  no   If yes to any of these questions please route to Joylene John, RN and Joella Prince, RN

## 2020-05-15 ENCOUNTER — Encounter: Payer: Self-pay | Admitting: Gastroenterology

## 2020-07-31 ENCOUNTER — Encounter: Payer: Self-pay | Admitting: Physician Assistant

## 2020-07-31 ENCOUNTER — Ambulatory Visit (INDEPENDENT_AMBULATORY_CARE_PROVIDER_SITE_OTHER): Payer: 59 | Admitting: Physician Assistant

## 2020-07-31 VITALS — BP 130/86 | HR 86 | Ht 65.0 in | Wt 201.4 lb

## 2020-07-31 DIAGNOSIS — G8929 Other chronic pain: Secondary | ICD-10-CM

## 2020-07-31 DIAGNOSIS — R1032 Left lower quadrant pain: Secondary | ICD-10-CM | POA: Diagnosis not present

## 2020-07-31 MED ORDER — DICYCLOMINE HCL 10 MG PO CAPS: ORAL_CAPSULE | ORAL | 2 refills | Status: AC

## 2020-07-31 NOTE — Progress Notes (Signed)
Chief Complaint: Follow-up left lower quadrant pain  HPI:    Andrew Krueger is a 54 year old Hispanic Spanish-speaking male with a past medical history as listed below, known to Dr. Tarri Glenn, who returns to clinic today for follow-up of his left lower quadrant abdominal pain.    05/12/2020 colonoscopy with 1 10 mm polyp in the descending colon, 3 2-3 mm polyps in the proximal transverse colon and in the ascending colon, one 2 mm polyp in the distal transverse colon and otherwise normal.  There is no obvious source for abdominal pain identified.  Pathology showed tubular adenomas.  Repeat recommended in 3 years.   01/31/2020 patient seen in clinic and at that time described left-sided abdominal pain for the past 2 months which radiated into his groin.  Bowel habits also seem to change of food that he is eating sometimes diarrhea other times not.  At that time ordered CT of the abdomen pelvis with contrast for further evaluation.    02/08/2020 CT the abdomen pelvis showed a short segment of apparent wall thickening at the junction of the descending and sigmoid colon.  This is thought possibly related to segmental nondistention or short segmental colitis.  No significant diverticular change or diverticulitis.  Hepatic steatosis.  Prominent mid appendix with minimal intraluminal fluid but no evidence of appendicitis.  As well as a small hiatal hernia.  Patient given trial of Cipro 500 twice daily x7 days and Flagyl 500 3 times daily x7 days to see if this helped.    7/28 patient called and told us he was having diarrhea.  He was told to use Imodium over-the-counter and Lomotil if this is inadequate.    04/23/2020 patient seen in clinic for follow-up of his left lower quadrant pain.  That time describes he took only 4 days of the Cipro and Flagyl because it was "not helping" and even seemed to make things slightly worse.  That time of been scheduled for colonoscopy.  Prescribe dicyclomine 10 mg tabs 1 every 4-6 hours as  needed.  Discussed trying probiotics.    Today, patient seen with the help of an interpreter, tells me that his pain has decreased and is actually much better.  It happens now maybe 3 times a week, still in his left lower quadrant and last 30 minutes to an hour.  Seems unrelated to eating or anything else.  He has not tried the Dicyclomine as prescribed at last visit and tells me he actually never picked it up from the pharmacy because it had decreased.    Denies fever, chills or change in bowel habits.  Past Medical/ Past Surgical History:  Procedure Laterality Date   CHOLECYSTECTOMY      Current Outpatient Medications  Medication Sig Dispense Refill   dicyclomine (BENTYL) 10 MG capsule Take 1 tablet by mouth every 4-6 hours as needed (Patient not taking: Reported on 05/12/2020) 90 capsule 11   diphenoxylate-atropine (LOMOTIL) 2.5-0.025 MG tablet Take 1 tablet by mouth 3 (three) times daily as needed. (Patient not taking: Reported on 04/23/2020)     No current facility-administered medications for this visit.    Allergies as of 07/31/2020   (No Known Allergies)    Family History  Problem Relation Age of Onset   Colon cancer Neg Hx    Esophageal cancer Neg Hx    Stomach cancer Neg Hx    Colon polyps Neg Hx     Social History   Socioeconomic History   Marital status: Legally Separated  Spouse name: Not on file   Number of children: Not on file   Years of education: Not on file   Highest education level: Not on file  Occupational History   Not on file  Tobacco Use   Smoking status: Former Smoker    Packs/day: 1.00    Years: 7.00    Pack years: 7.00    Start date: 57    Quit date: 1990    Years since quitting: 31.9   Smokeless tobacco: Never Used  Scientific laboratory technician Use: Never used  Substance and Sexual Activity   Alcohol use: Not Currently   Drug use: Not Currently   Sexual activity: Not on file  Other Topics Concern   Not on file  Social  History Narrative   Not on file   Social Determinants of Health   Financial Resource Strain:    Difficulty of Paying Living Expenses: Not on file  Food Insecurity:    Worried About Charity fundraiser in the Last Year: Not on file   [employer] of Food in the Last Year: Not on file  Transportation Needs:    Lack of Transportation (Medical): Not on file   Lack of Transportation (Non-Medical): Not on file  Physical Activity:    Days of Exercise per Week: Not on file   Minutes of Exercise per Session: Not on file  Stress:    Feeling of Stress : Not on file  Social Connections:    Frequency of Communication with Friends and Family: Not on file   Frequency of Social Gatherings with Friends and Family: Not on file   Attends Religious Services: Not on file   Active Member of Clubs or Organizations: Not on file   Attends Archivist Meetings: Not on file   Marital Status: Not on file  Intimate Partner Violence:    Fear of Current or Ex-Partner: Not on file   Emotionally Abused: Not on file   Physically Abused: Not on file   Sexually Abused: Not on file    Review of Systems:    Constitutional: No weight loss, fever or chills Cardiovascular: No chest pain Respiratory: No SOB  Gastrointestinal: See HPI and otherwise negative   Physical Exam:  Vital signs: BP 130/86 (BP Location: Left Arm, Patient Position: Sitting, Cuff Size: Large)    Pulse 86    Ht 5\' 5"  (1.651 m)    Wt 201 lb 6 oz (91.3 kg)    BMI 33.51 kg/m   Constitutional:   Pleasant Hispanic male appears to be in NAD, Well developed, Well nourished, alert and cooperative Respiratory: Respirations even and unlabored. Lungs clear to auscultation bilaterally.   No wheezes, crackles, or rhonchi.  Cardiovascular: Normal S1, S2. No MRG. Regular rate and rhythm. No peripheral edema, cyanosis or pallor.  Gastrointestinal:  Soft, nondistended, nontender. No rebound or guarding. Normal bowel sounds. No  appreciable masses or hepatomegaly. Rectal:  Not performed.  Psychiatric: Demonstrates good judgement and reason without abnormal affect or behaviors.  No recent labs or imaging.  Assessment: 1.  Chronic left lower quadrant pain: Initially CT showing some wall thickening in the descending and sigmoid colon, colonoscopy with no abnormalities, pain has decreased over the past month; consider most likely irritable bowel type spasms  Plan: 1.  Recommend the patient trial his Dicyclomine 10 mg every 4-6 hours as needed for left lower quadrant abdominal pain.  Refilled prescription #30 with 3 refills. 2.  Patient asked about specific  diet.  There is nothing that really seems to be bothering him.  Told him he can eat whatever he would like unless he notices something that particularly causes the issue. 3.  Patient to follow in clinic with [place] as needed.  Ellouise Newer, PA-C [facility] 07/31/2020, 10:16 AM  Cc: Caryl Never*

## 2020-07-31 NOTE — Patient Instructions (Addendum)
If you are age 54 or older, your body mass index should be between 23-30. Your Body mass index is 33.51 kg/m. If this is out of the aforementioned range listed, please consider follow up with your Primary Care Provider.  If you are age 24 or younger, your body mass index should be between 19-25. Your Body mass index is 33.51 kg/m. If this is out of the aformentioned range listed, please consider follow up with your Primary Care Provider.   We have sent the following medications to your pharmacy for you to pick up at your convenience: Dicyclomine 10 mg every 4-6 hours as needed for lower left abdominal pain.  Follow up as needed    Thank you for choosing me and [facility].  Ellouise Newer. PA-C

## 2020-07-31 NOTE — Progress Notes (Signed)
Reviewed and agree with management plans. ? ?Charolett Yarrow L. Jequan Shahin, MD, MPH  ?

## 2021-11-21 IMAGING — CT CT ABD-PELV W/ CM
2 of 5 series · 15 of 46 positions shown, 17 images · IV contrast (OMNIPAQUE 300)
Comparison: None.

CLINICAL DATA: Left lower quadrant abdominal pain. Change in bowel
habits.

EXAM:
CT ABDOMEN AND PELVIS WITH CONTRAST
TECHNIQUE: Multidetector CT imaging of the abdomen and pelvis was performed
using the standard protocol following bolus administration of
intravenous contrast.
CONTRAST:  100mL OMNIPAQUE IOHEXOL 300 MG/ML  SOLN

[Series 2: abd/pel w · axial · 0.74mm/px · z∈[-506,-51]mm · 12 of 103 slices shown, 14 images]
[im 6/103  soft-tissue]
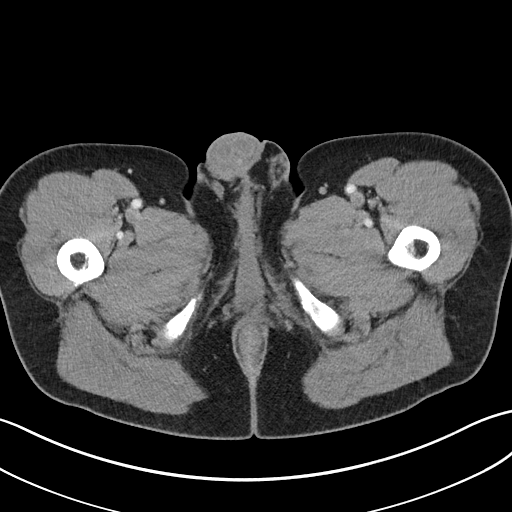
[im 6/103  bone]
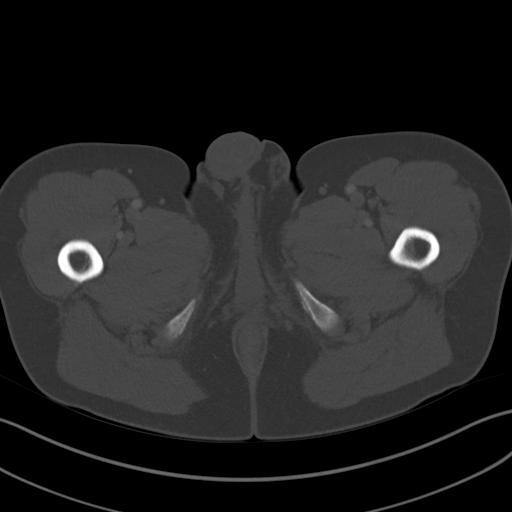
[im 17/103  soft-tissue]
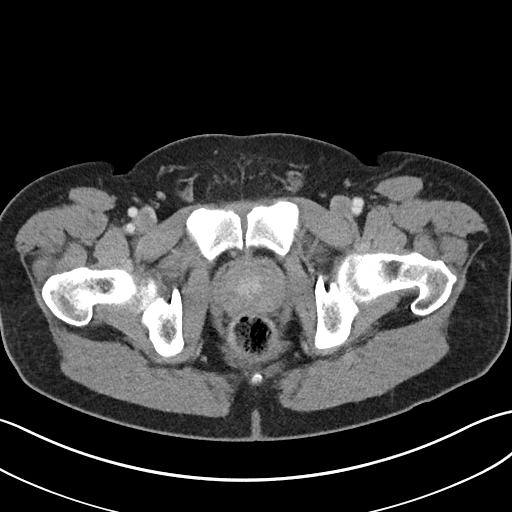
[im 22/103  soft-tissue]
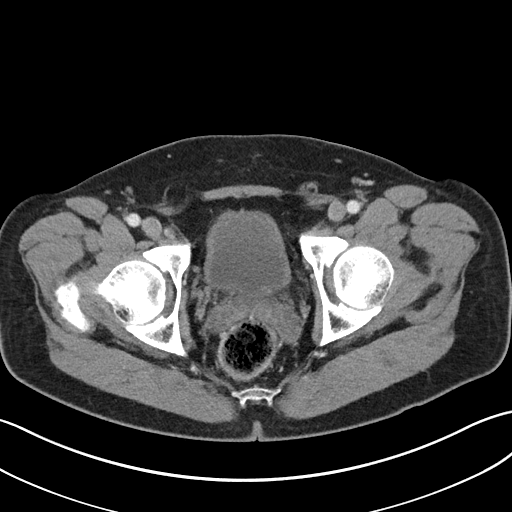
[im 33/103  soft-tissue]
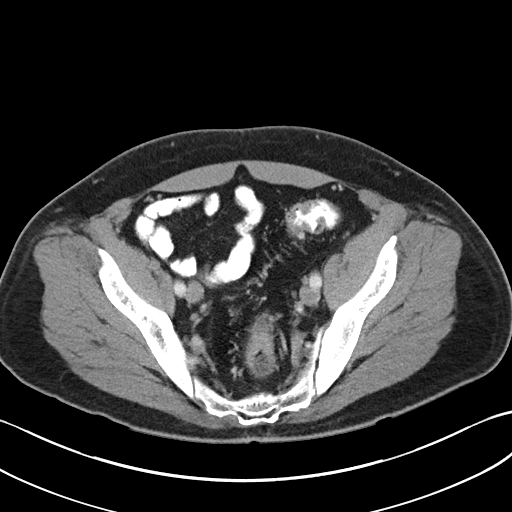
[im 38/103  soft-tissue]
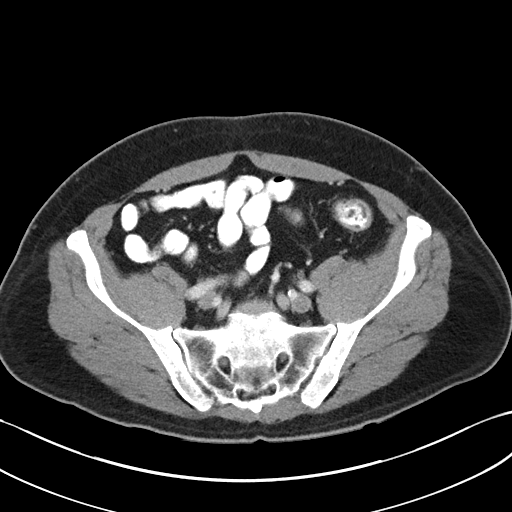
[im 49/103  soft-tissue]
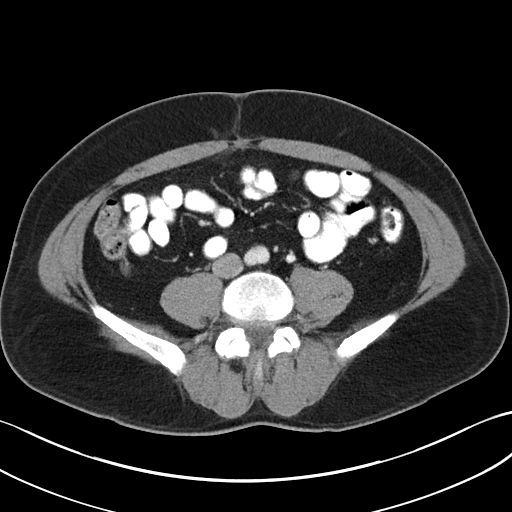
[im 54/103  soft-tissue]
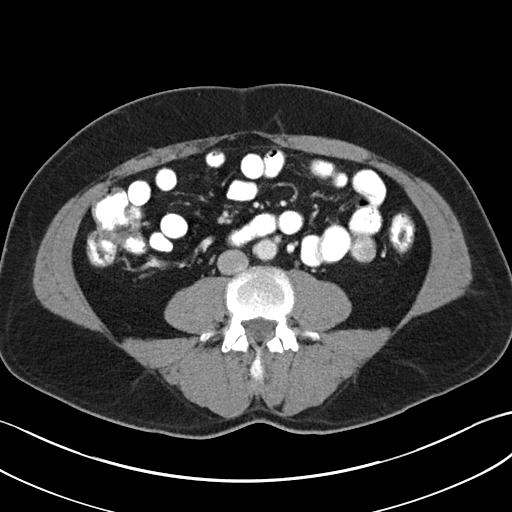
[im 65/103  soft-tissue]
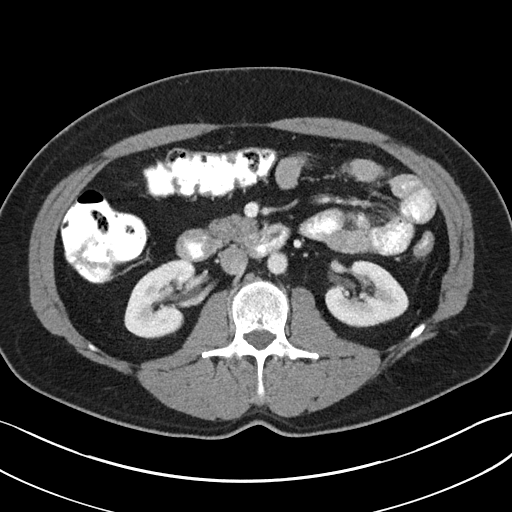
[im 70/103  soft-tissue]
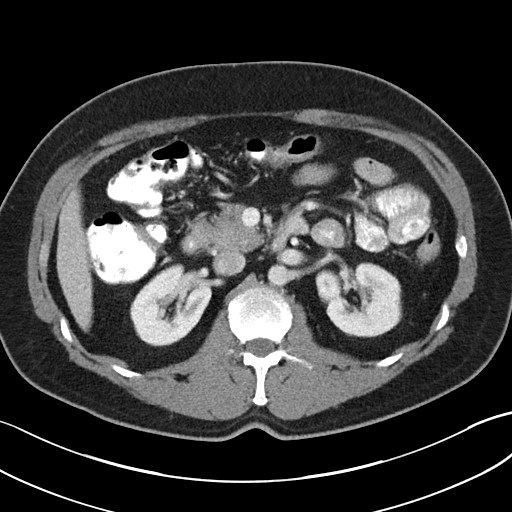
[im 70/103  bone]
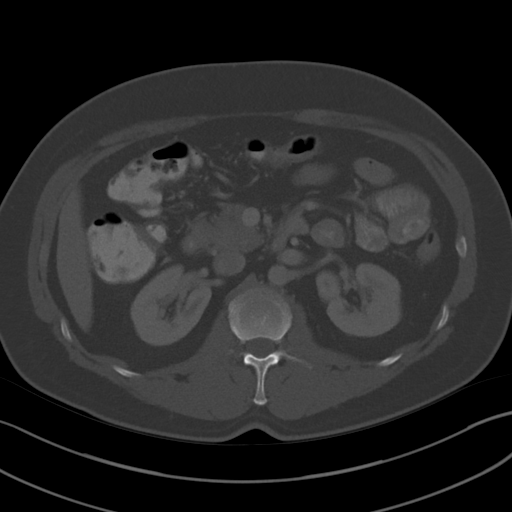
[im 81/103  soft-tissue]
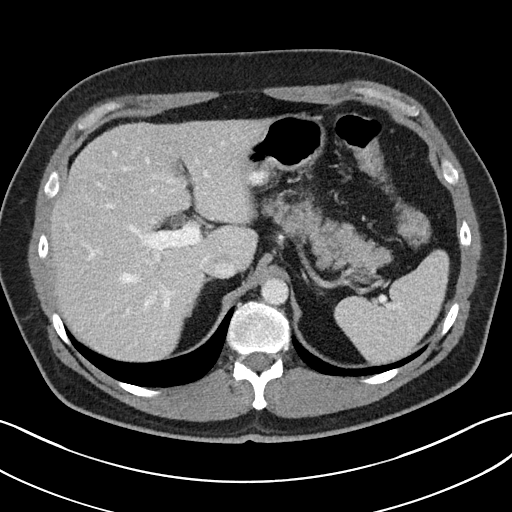
[im 86/103  soft-tissue]
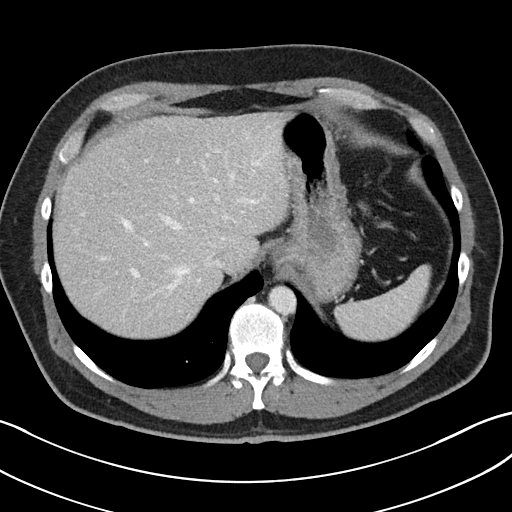
[im 97/103  soft-tissue]
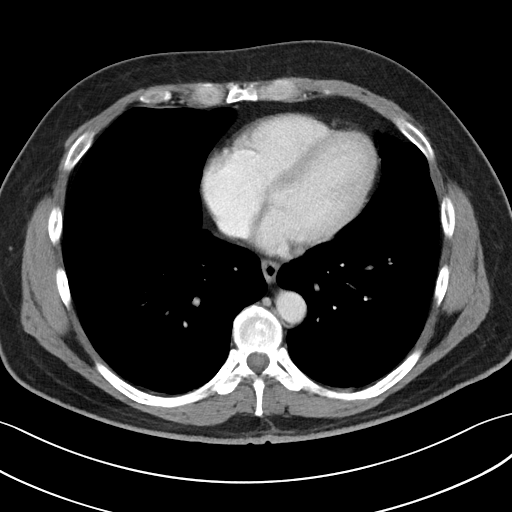

[Series 5: abd/pel w st · coronal · 0.77mm/px · 3 of 86 slices shown]
[im 29/86  soft-tissue]
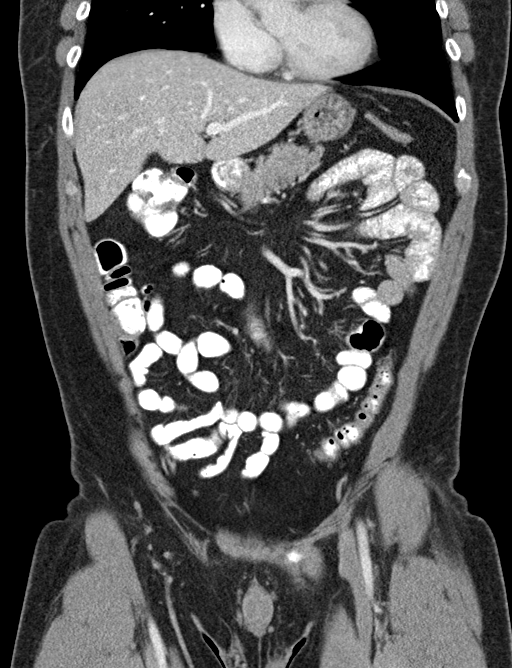
[im 38/86  soft-tissue]
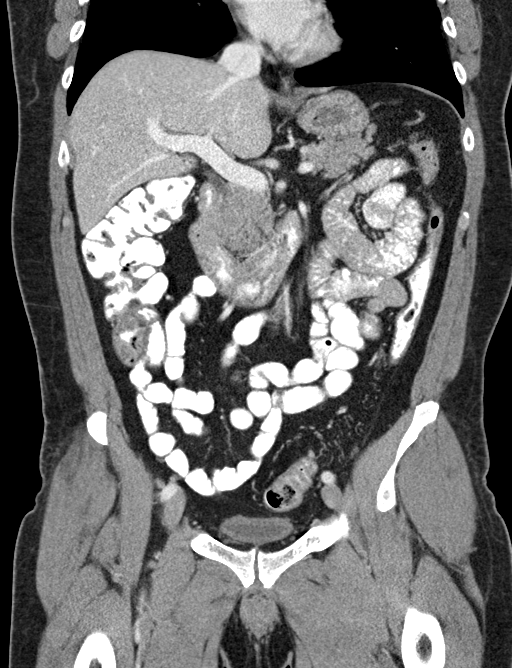
[im 48/86  soft-tissue]
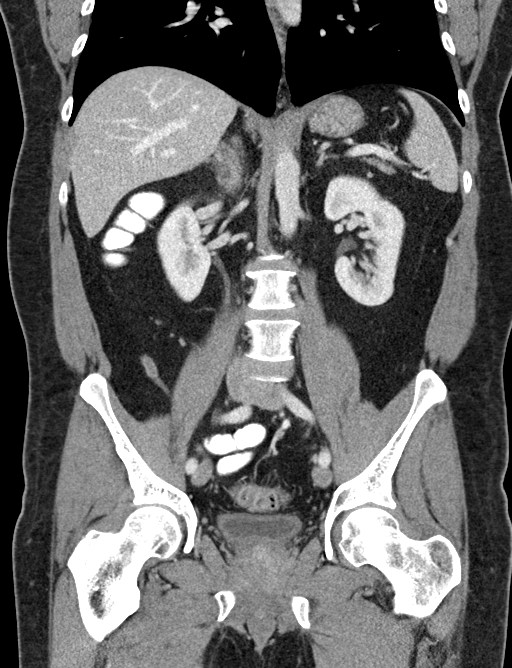

[15 of 46 positions shown; findings below may reference images not displayed]

FINDINGS: Lower chest: Lung bases are clear.

Hepatobiliary: Diffusely decreased hepatic density consistent with
steatosis. Tiny subcentimeter hypodensities in the dome of the liver
are too small to accurately characterize. Postcholecystectomy
without biliary dilatation.

Pancreas: No ductal dilatation or inflammation. No evidence of focal
lesion.

Spleen: Normal in size without focal abnormality.

Adrenals/Urinary Tract: Normal adrenal glands. No hydronephrosis or
perinephric edema. Homogeneous renal enhancement with symmetric
excretion on delayed phase imaging. Urinary bladder is minimally
distended. Mild bladder wall thickening is likely due to
nondistention.

Stomach/Bowel: Small hiatal hernia. The stomach is unremarkable.
There is no small bowel obstruction, wall thickening, or
inflammatory change. Terminal ileum is normal. The proximal appendix
measures 7 mm and is fluid-filled, however no periappendiceal fat
stranding. No appendicolith. Administered enteric contrast reaches
the sigmoid colon. Cecum, ascending, proximal transverse colon are
normal. Distal transverse colon and splenic flexure are decompressed
which limits detailed assessment, no evidence of colonic wall
thickening or pericolonic edema. Descending colon is normal. Short
segment of apparent wall thickening at the junction of the
descending and sigmoid colon, series 2, image 74. No pericolonic
inflammation. There is stool in the sigmoid colon and rectum. No
significant diverticular disease.

Vascular/Lymphatic: No enlarged lymph nodes in the abdomen or
pelvis. Normal caliber abdominal aorta. Portal vein is patent.

Reproductive: Prominent prostate gland spans 5.2 cm.

Other: There is minimal fat in the inguinal canals without frank
hernia. Tiny fat containing umbilical hernia. No intra-abdominal
fluid collection or free fluid.

Musculoskeletal: There are no acute or suspicious osseous
abnormalities.
IMPRESSION: 1. Short segment of apparent wall thickening at the junction of the
descending and sigmoid colon. This may be related to segmental
nondistention or short segment colitis. Recommend up-to-date
colonoscopy to exclude possibility of underlying colonic neoplasm.
2. No significant diverticular change or diverticulitis.
3. Hepatic steatosis.
4. Prominent mid appendix with minimal intraluminal fluid but no
evidence of appendicitis. This may be normal for this patient,
however the possibility of an appendix mucocele is a also
considered. Recommend elective surgical evaluation.
5. Small hiatal hernia.
